# Patient Record
Sex: Female | Born: 1940 | Race: White | Hispanic: No | Marital: Single | Smoking: Never smoker
Health system: Southern US, Community
[De-identification: ages and names within clinical notes are randomized; demographics above are authoritative.]

## PROBLEM LIST (undated history)

## (undated) DIAGNOSIS — G4730 Sleep apnea, unspecified: Secondary | ICD-10-CM

## (undated) DIAGNOSIS — E785 Hyperlipidemia, unspecified: Secondary | ICD-10-CM

## (undated) DIAGNOSIS — I10 Essential (primary) hypertension: Secondary | ICD-10-CM

## (undated) DIAGNOSIS — B351 Tinea unguium: Secondary | ICD-10-CM

## (undated) DIAGNOSIS — M1990 Unspecified osteoarthritis, unspecified site: Secondary | ICD-10-CM

## (undated) DIAGNOSIS — I499 Cardiac arrhythmia, unspecified: Secondary | ICD-10-CM

## (undated) DIAGNOSIS — I493 Ventricular premature depolarization: Secondary | ICD-10-CM

## (undated) DIAGNOSIS — K219 Gastro-esophageal reflux disease without esophagitis: Secondary | ICD-10-CM

## (undated) DIAGNOSIS — D696 Thrombocytopenia, unspecified: Secondary | ICD-10-CM

## (undated) DIAGNOSIS — IMO0001 Reserved for inherently not codable concepts without codable children: Secondary | ICD-10-CM

## (undated) DIAGNOSIS — N6019 Diffuse cystic mastopathy of unspecified breast: Secondary | ICD-10-CM

## (undated) DIAGNOSIS — G5603 Carpal tunnel syndrome, bilateral upper limbs: Secondary | ICD-10-CM

## (undated) DIAGNOSIS — I6521 Occlusion and stenosis of right carotid artery: Secondary | ICD-10-CM

## (undated) HISTORY — PX: BREAST EXCISIONAL BIOPSY: SUR124

## (undated) HISTORY — DX: Ventricular premature depolarization: I49.3

## (undated) HISTORY — DX: Hyperlipidemia, unspecified: E78.5

## (undated) HISTORY — DX: Thrombocytopenia, unspecified: D69.6

## (undated) HISTORY — DX: Tinea unguium: B35.1

## (undated) HISTORY — DX: Diffuse cystic mastopathy of unspecified breast: N60.19

## (undated) HISTORY — DX: Unspecified osteoarthritis, unspecified site: M19.90

## (undated) HISTORY — DX: Essential (primary) hypertension: I10

## (undated) HISTORY — DX: Gastro-esophageal reflux disease without esophagitis: K21.9

## (undated) HISTORY — DX: Carpal tunnel syndrome, bilateral upper limbs: G56.03

---

## 2002-05-07 ENCOUNTER — Encounter: Payer: Self-pay | Admitting: Cardiovascular Disease

## 2002-05-12 ENCOUNTER — Encounter: Payer: Self-pay | Admitting: Cardiovascular Disease

## 2003-02-27 HISTORY — PX: CARDIAC CATHETERIZATION: SHX172

## 2003-10-29 ENCOUNTER — Encounter: Payer: Self-pay | Admitting: Cardiovascular Disease

## 2003-11-04 ENCOUNTER — Encounter: Payer: Self-pay | Admitting: Cardiovascular Disease

## 2003-11-17 ENCOUNTER — Inpatient Hospital Stay (HOSPITAL_COMMUNITY): Admission: EM | Admit: 2003-11-17 | Discharge: 2003-11-18 | Payer: Self-pay | Admitting: *Deleted

## 2004-07-12 ENCOUNTER — Ambulatory Visit: Payer: Self-pay | Admitting: Unknown Physician Specialty

## 2005-07-18 ENCOUNTER — Ambulatory Visit: Payer: Self-pay | Admitting: Unknown Physician Specialty

## 2005-11-21 ENCOUNTER — Ambulatory Visit: Payer: Self-pay | Admitting: Unknown Physician Specialty

## 2006-07-24 ENCOUNTER — Ambulatory Visit: Payer: Self-pay | Admitting: Unknown Physician Specialty

## 2006-11-20 ENCOUNTER — Encounter: Payer: Self-pay | Admitting: Cardiovascular Disease

## 2007-07-30 ENCOUNTER — Ambulatory Visit: Payer: Self-pay | Admitting: Unknown Physician Specialty

## 2008-02-27 HISTORY — PX: CATARACT EXTRACTION: SUR2

## 2008-08-06 ENCOUNTER — Ambulatory Visit: Payer: Self-pay | Admitting: Unknown Physician Specialty

## 2008-09-15 ENCOUNTER — Encounter: Payer: Self-pay | Admitting: Cardiovascular Disease

## 2009-08-10 ENCOUNTER — Ambulatory Visit: Payer: Self-pay | Admitting: Unknown Physician Specialty

## 2009-12-14 ENCOUNTER — Encounter: Payer: Self-pay | Admitting: Cardiovascular Disease

## 2010-04-06 ENCOUNTER — Ambulatory Visit: Payer: Self-pay | Admitting: Unknown Physician Specialty

## 2010-04-07 LAB — PATHOLOGY REPORT

## 2010-05-20 ENCOUNTER — Encounter: Payer: Self-pay | Admitting: Cardiovascular Disease

## 2010-05-20 ENCOUNTER — Emergency Department: Payer: Self-pay | Admitting: Emergency Medicine

## 2010-05-24 ENCOUNTER — Encounter: Payer: Self-pay | Admitting: Cardiovascular Disease

## 2010-05-26 ENCOUNTER — Encounter: Payer: Self-pay | Admitting: Cardiovascular Disease

## 2010-06-06 ENCOUNTER — Encounter: Payer: Self-pay | Admitting: Cardiovascular Disease

## 2010-06-06 ENCOUNTER — Ambulatory Visit (INDEPENDENT_AMBULATORY_CARE_PROVIDER_SITE_OTHER): Payer: PRIVATE HEALTH INSURANCE | Admitting: Cardiovascular Disease

## 2010-06-06 DIAGNOSIS — R079 Chest pain, unspecified: Secondary | ICD-10-CM | POA: Insufficient documentation

## 2010-06-06 DIAGNOSIS — E785 Hyperlipidemia, unspecified: Secondary | ICD-10-CM

## 2010-06-06 DIAGNOSIS — E7849 Other hyperlipidemia: Secondary | ICD-10-CM | POA: Insufficient documentation

## 2010-06-06 DIAGNOSIS — I10 Essential (primary) hypertension: Secondary | ICD-10-CM | POA: Insufficient documentation

## 2010-06-06 DIAGNOSIS — I493 Ventricular premature depolarization: Secondary | ICD-10-CM

## 2010-06-06 DIAGNOSIS — I739 Peripheral vascular disease, unspecified: Secondary | ICD-10-CM | POA: Insufficient documentation

## 2010-06-06 DIAGNOSIS — I4949 Other premature depolarization: Secondary | ICD-10-CM

## 2010-06-06 NOTE — Assessment & Plan Note (Signed)
Rare PVCs by history and on EKG today. She is relatively asymptomatic.

## 2010-06-06 NOTE — Assessment & Plan Note (Signed)
Blood pressure is well controlled on today's visit. No changes made to the medications. 

## 2010-06-06 NOTE — Assessment & Plan Note (Signed)
She had a routine carotid done recently which per her report showed stable disease on the right side.

## 2010-06-06 NOTE — Assessment & Plan Note (Signed)
Symptoms were somewhat atypical but concerning given her previous cardiac catheter showing diagonal disease and given her underlying peripheral vascular disease of the carotid. She has been well since the episode 3 weeks ago. We have suggested that we exercise her on a routine treadmill study which will be done in the next week or so. I suggested that she contact me if she has further episodes of chest discomfort with exertion.

## 2010-06-06 NOTE — Progress Notes (Signed)
   Patient ID: Gabrielle Morris, female    DOB: Mar 10, 1940, 70 y.o.   MRN: [id number]  HPI Comments: 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 Lin Givens for substernal chest pain 2 weeks ago.  She reports that on 21 March She was mowing for several hours and then went inside and had some soup. She did not feel well after the mowing after drinking the soup, she had chest discomfort rated at 8/10 lasting for approximately 2 hours. It resolved without any intervention. Since then she has felt a little bit tired. She has been working back in the garden, taking Halls, planting various plants and mowing again. She is not had any further episodes of chest discomfort with activity. She denies any diaphoresis, arm pain, neck pain or jaw pain with exertion.  She is relatively active at baseline. She is indicated that she does not like to take medications and does not want a cholesterol medication.  EKG shows normal sinus rhythm with rate 76 beats per minute with no significant ST or T wave changes, rare PVC      Review of Systems  Constitutional: Negative.   HENT: Negative.   Eyes: Negative.   Respiratory: Negative.   Cardiovascular: Positive for chest pain.       Episode of chest pain, no further episodes in 3 weeks.  Gastrointestinal: Negative.   Musculoskeletal: Negative.   Skin: Negative.   Neurological: Negative.   Hematological: Negative.   Psychiatric/Behavioral: Negative.   All other systems reviewed and are negative.    BP 132/70  Pulse 76  Ht 5\' 6"  (1.676 m)  Wt 180 lb 12.8 oz (82.01 kg)  BMI 29.18 kg/m2   Physical Exam  Nursing note and vitals reviewed. Constitutional: She is oriented to person, place, and time. She appears well-developed and well-nourished.   HENT:  Head: Normocephalic.  Nose: Nose normal.  Mouth/Throat: Oropharynx is clear and moist.  Eyes: Conjunctivae are normal. Pupils are equal, round, and reactive to light.  Neck: Normal range of motion. Neck supple. No JVD present. Carotid bruit is present.  Cardiovascular: Normal rate, regular rhythm, normal heart sounds and intact distal pulses.  Exam reveals no gallop and no friction rub.   No murmur heard. Pulmonary/Chest: Effort normal and breath sounds normal. No respiratory distress. She has no wheezes. She has no rales. She exhibits no tenderness.  Abdominal: Soft. Bowel sounds are normal. She exhibits no distension. There is no tenderness.  Musculoskeletal: Normal range of motion. She exhibits no edema and no tenderness.  Lymphadenopathy:    She has no cervical adenopathy.  Neurological: She is alert and oriented to person, place, and time. Coordination normal.  Skin: Skin is warm and dry. No rash noted. No erythema.  Psychiatric: She has a normal mood and affect. Her behavior is normal. Judgment and thought content normal.         Assessment and Plan

## 2010-06-06 NOTE — Patient Instructions (Addendum)
We have set up a treadmill study on : 06/19/10 @ 12:30 No medication changes were made. Please call the office for additional episodes of chest pain. Please call us if you have new issues that need to be addressed before your next appt.  We will call you for a follow up Appt.

## 2010-06-06 NOTE — Assessment & Plan Note (Signed)
Ideally, goal LDL should be less than 70, total cholesterol less than 150. Her total cholesterol currently is 190 with LDL of 110. She is not interested in being on a cholesterol medication at this time. We did suggest that she could try a red yeast rice.

## 2010-06-19 ENCOUNTER — Encounter: Payer: Self-pay | Admitting: Cardiovascular Disease

## 2010-06-19 ENCOUNTER — Ambulatory Visit (INDEPENDENT_AMBULATORY_CARE_PROVIDER_SITE_OTHER): Payer: PRIVATE HEALTH INSURANCE | Admitting: Cardiovascular Disease

## 2010-06-19 DIAGNOSIS — R079 Chest pain, unspecified: Secondary | ICD-10-CM

## 2010-06-19 NOTE — Progress Notes (Signed)
  Treadmill ordered for recent epsiodes of chest pain.  Resting EKG shows NSR with rate of 77 bpm, no significant St or T wave changes Resting blood pressure of 122/60 Stand bruce protocal was used.  Patient exercised for 6 min Peak heart rate of 133 bpm.  This was 88% of the maximum predicted heart rate (target heart rate 128). No symptoms of chest pain or lightheadedness were reported at peak stress or in recovery.  Peak Blood pressure recorded was 142/72. Heart rate at 3 minutes in recovery was 87  FINAL IMPRESSION: Normal exercise stress test. No significant EKG changes concerning for ischemia. Good exercise tolerance.

## 2010-06-19 NOTE — Progress Notes (Deleted)
Adult Stress Test Report  06/19/2010   Requesting Physician: Provider Not In System  Study: {noninvasive testing:14697}  Pre-test ECG: {normal/abnormal:14647}  Level of Stress:  ***% age-predicted max HR  *** METS achieved  Functional Capacity: {funct capacity:14698}  Abnormal Symptoms: {symptoms:14699}  Heart Rate Response: {hr response:14700}  BP Response:  {bp response:14701}  Baseline LVEF: Echo *** %,  Nuclear *** %  Stress ECG: {findings; ecg:14702}  Stress Imaging Report:  {findings; stress imaging:14703}   Impression:   {findings; stress test:14704}  Interpreted by:  Lysbeth Galas 06/19/2010

## 2010-07-31 ENCOUNTER — Other Ambulatory Visit: Payer: Self-pay | Admitting: Cardiovascular Disease

## 2010-08-24 ENCOUNTER — Ambulatory Visit: Payer: Self-pay | Admitting: Unknown Physician Specialty

## 2011-02-27 HISTORY — PX: PATELLA FRACTURE SURGERY: SHX735

## 2011-08-06 ENCOUNTER — Ambulatory Visit: Payer: Self-pay | Admitting: Orthopedic Surgery

## 2011-08-07 ENCOUNTER — Observation Stay: Payer: Self-pay

## 2011-08-27 ENCOUNTER — Ambulatory Visit: Payer: Self-pay | Admitting: Internal Medicine

## 2012-08-27 ENCOUNTER — Ambulatory Visit: Payer: Self-pay | Admitting: Internal Medicine

## 2013-06-01 ENCOUNTER — Ambulatory Visit: Payer: Self-pay | Admitting: Orthopedic Surgery

## 2013-06-01 LAB — POTASSIUM: POTASSIUM: 3.9 mmol/L (ref 3.5–5.1)

## 2013-06-09 ENCOUNTER — Ambulatory Visit: Payer: Self-pay | Admitting: Orthopedic Surgery

## 2013-08-31 ENCOUNTER — Ambulatory Visit: Payer: Self-pay | Admitting: Internal Medicine

## 2013-09-03 DIAGNOSIS — R002 Palpitations: Secondary | ICD-10-CM | POA: Insufficient documentation

## 2014-06-19 NOTE — Op Note (Signed)
PATIENT NAME:  Gabrielle Morris, CHICK MR#:  [id number] DATE OF BIRTH:  1941/01/08  DATE OF PROCEDURE:  06/09/2013  PREOPERATIVE DIAGNOSIS: Stenosis painful hardware, right patella.   POSTOPERATIVE DIAGNOSIS: Stenosis painful hardware, right patella.  PROCEDURE: Removal of deep hardware, right patella.   ANESTHESIA: General.   SURGEON: Hessie Knows, M.D.   DESCRIPTION OF PROCEDURE: The patient was brought to the operating room and after adequate anesthesia was obtained, the right leg was prepped and draped in the usual sterile fashion with a tourniquet applied to the upper thigh. After patient identification and timeout procedures were completed, the tourniquet was raised to 300 mmHg. Most of the prior anterior midline skin incision was utilized with the incision down through the skin and subcutaneous tissue. The hardware was visible very quickly and the figure-of-eight tension band wire was exposed first. Following this at the proximal end of the patella within the quad tendon, the ends of the 2 K wires were exposed and the wires removed without difficulty. The tension band wire was then cut and removed without difficulty. The skin was then elevated off the bone where it had become adherent. The wound was irrigated and then wound closed with 3-0 Vicryl subcutaneously and skin staples. 20 mL of 0.5% Sensorcaine without epinephrine was infiltrated for postop analgesia. Sterile dressings of Xeroform, 4 x 4, Webril and Ace wrap applied. Tourniquet time was 20 minutes.  There were no complications. Hardware was discarded.   ESTIMATED BLOOD LOSS: Minimal   ____________________________ Laurene Footman, MD mjm:ce D: 06/09/2013 18:36:45 ET T: 06/09/2013 18:51:17 ET JOB#: [id number]  cc: Laurene Footman, MD, <Dictator> Laurene Footman MD ELECTRONICALLY SIGNED 06/10/2013 11:51

## 2014-06-20 NOTE — Op Note (Signed)
PATIENT NAME:  Gabrielle Morris, Gabrielle Morris MR#:  [id number] DATE OF BIRTH:  1940-03-05  DATE OF PROCEDURE:  08/07/2011  PREOPERATIVE DIAGNOSIS: Right patella fracture.   POSTOPERATIVE DIAGNOSIS: Right patella fracture.  PROCEDURE: Open reduction internal fixation right patella fracture.   SURGEON: Laurene Footman, MD   ANESTHESIA: General.    DESCRIPTION OF PROCEDURE: The patient was brought to the Operating Room, and after adequate anesthesia was obtained the right leg was prepped and draped in the usual sterile fashion. A tourniquet was applied to the upper thigh but not utilized during the procedure. After patient identification and timeout procedures were completed, a midline skin incision was made followed by inspection of the fracture. There displacement in the midportion of the fracture of about 1 cm. A reduction clamp was applied, and on AP and lateral projections near anatomic alignment was obtained. Two K wires were then inserted from distal to proximal across the fracture and visualized proximally. AP and lateral imaging showed these to be in the appropriate position for repair. A tension band wire was then placed in a figure-of-eight fashion and tightened. This gave near anatomic alignment on, again, both AP and lateral projections. After checking this, the proximal end of the pins were bent over and impacted into the proximal patella to bury them and prevent them from being bothersome. The distal ends were then cut short and bent over towards the distal patella and patellar tendon, and the twisted K wire that was holding the tension band was also impacted against the patella to try to minimize its prominence. Permanent C-arm views were obtained. The wound was thoroughly irrigated and closed with 2-0 Vicryl subcutaneously followed by skin staples, Xeroform, 4 x 4's, Webril and Ace wrap and a knee immobilizer that the patient came to the OR with.   ESTIMATED BLOOD LOSS: 50.   COMPLICATIONS: None.    SPECIMEN: None.   IMPLANTS: 2.0 K-wire x2 and large cerclage wire.   ____________________________ Laurene Footman, MD mjm:cbb D: 08/07/2011 21:25:56 ET T: 08/08/2011 09:54:01 ET JOB#: [id number]  cc: Laurene Footman, MD, <Dictator> Laurene Footman MD ELECTRONICALLY SIGNED 08/08/2011 13:03

## 2014-06-20 NOTE — Discharge Summary (Signed)
PATIENT NAME:  Gabrielle Morris, Gabrielle Morris MR#:  [id number] DATE OF BIRTH:  [birth year]  DATE OF ADMISSION:  08/07/2011 DATE OF DISCHARGE:  08/08/2011   ADMITTING DIAGNOSIS: Right patella fracture.   DISCHARGE DIAGNOSIS: Right patella fracture.   PROCEDURE: Open reduction and internal fixation of right patella fracture.   SURGEON: Laurene Footman, M.D.   ANESTHESIA: General.   ESTIMATED BLOOD LOSS: 50 mL.   COMPLICATIONS: None.   IMPLANTS: 2.0 K-wire times two and large cerclage wire.   COMPLICATIONS: None.   The patient was stabilized, brought to the recovery room, and then brought down to the orthopedic floor where she was treated by physical therapy and for pain control.   HISTORY: The patient is a 74 year old female who was walking and walked right into a knee-high concrete structure on 07/27/2011. The patient was seen in the ER and had x-rays which showed a comminuted knee fracture. She was seen in our clinic one week after and was scheduled for surgery. The patient continues to wear the knee immobilizer. She has been taking 4 to 6 oxycodone tablets a day for pain. She denies any hip or ankle injury.   PHYSICAL EXAMINATION: GENERAL: Well-developed, well-nourished female in no apparent distress. Normal affect. She presents in a wheelchair with knee immobilizer on the right lower extremity. RIGHT LOWER EXTREMITY EXAMINATION: Right lower extremity shows effusion within the right knee. There is some mild erythema over the patella, with no warmth or discharge noted. There is also some ecchymosis in the posterior aspect of right lower extremity. The patient has a negative Homans sign. There are no signs of infection. The patient is tender along the medial and lateral joint lines of the right knee. The range of motion is limited secondary to pain. NEUROLOGIC: The patient is neurovascularly intact in the right lower extremity vascular. VASCULAR: The patient has less than 2-second capillary refill.  Dorsalis pedis and posterior tibial pulses are intact in the right lower extremity. HEENT: Head is normocephalic, atraumatic. Pupils equal, round, reactive to light. The patient does not have any dentures. NECK: Symmetric. HEART: Reveals regular rate and rhythm. There is no murmur. There is a normal apical pulse. LUNGS: The lungs are clear to auscultation. There is no wheezing, rales, or rhonchi. There is normal expansion of bilateral chest walls. ABDOMEN: Soft, nontender, nondistended. Bowel sounds are normal.   HOSPITAL COURSE: After admission on 08/07/2011 the patient had surgery that same day. The patient had good pain control afterwards and was brought to the orthopedic floor from the PAC-U. The patient tolerated physical therapy well on postoperative day one. The patient's vital signs remained stable and the patient was ready to go to rehab on postoperative day one on 08/08/2011.   CONDITION AT DISCHARGE: Stable.   DISPOSITION: The patient was sent to rehab facility.   DISCHARGE INSTRUCTIONS:  1. The patient is to keep heels elevated.  2. She is to wear TED hose knee-high bilaterally.  3. She is to use incentive spirometry every one hour.  4. Physical therapy is to be consulted and she is to be evaluated and treated for difficulty walking. She is right extremity weight-bearing as tolerated. They are to evaluate and treat.  5. Activity is weight-bearing as tolerated with immobilizer. Keep knee fully extended.  6. The patient is to have a regular diet.  7. She is to follow up with [facility] orthopedics in 3 to 4 days for wound check and dressing change.   DISCHARGE MEDICATIONS:  1. Vicodin  5/325 tablet, 1 to 2 tablets oral 4-6 hours p.r.n. for pain.  2. Docusate calcium capsule 240 mg oral daily.  3. Milk of Magnesia 30 mL oral b.i.d. for constipation.  4. Aspirin 325 mg oral daily.  5. Zolpidem tablet 10 mg oral at bedtime p.r.n. for insomnia.  6. Pantoprazole tablet 40 mg oral  b.i.d.  7. Incentive spirometry once every hour.   8. Losartan tablet 50 mg oral daily. 9. Aspirin chewable 81 mg oral daily.    ____________________________ Duanne Guess, PA-C tcg:bjt D: 08/08/2011 14:41:32 ET T: 08/08/2011 15:52:47 ET JOB#: [id number]  cc: Duanne Guess, PA-C, <Dictator> Duanne Guess [place] ELECTRONICALLY SIGNED 08/28/2011 16:54

## 2014-07-15 ENCOUNTER — Other Ambulatory Visit: Payer: Self-pay | Admitting: Internal Medicine

## 2014-07-15 DIAGNOSIS — Z1231 Encounter for screening mammogram for malignant neoplasm of breast: Secondary | ICD-10-CM

## 2014-08-31 NOTE — Discharge Instructions (Signed)

## 2014-09-01 ENCOUNTER — Ambulatory Visit: Payer: Medicare Other | Admitting: Anesthesiology

## 2014-09-01 ENCOUNTER — Ambulatory Visit
Admission: RE | Admit: 2014-09-01 | Discharge: 2014-09-01 | Disposition: A | Discharge status: Home / Self Care | Payer: Medicare Other | Source: Ambulatory Visit | Attending: Ophthalmology | Admitting: Ophthalmology

## 2014-09-01 ENCOUNTER — Encounter
Admission: RE | Disposition: A | Discharge status: Home / Self Care | Payer: Self-pay | Source: Ambulatory Visit | Attending: Ophthalmology

## 2014-09-01 DIAGNOSIS — Z9889 Other specified postprocedural states: Secondary | ICD-10-CM | POA: Insufficient documentation

## 2014-09-01 DIAGNOSIS — H269 Unspecified cataract: Secondary | ICD-10-CM | POA: Diagnosis present

## 2014-09-01 DIAGNOSIS — Z79899 Other long term (current) drug therapy: Secondary | ICD-10-CM | POA: Diagnosis not present

## 2014-09-01 DIAGNOSIS — Z9861 Coronary angioplasty status: Secondary | ICD-10-CM | POA: Diagnosis not present

## 2014-09-01 DIAGNOSIS — Z9849 Cataract extraction status, unspecified eye: Secondary | ICD-10-CM | POA: Insufficient documentation

## 2014-09-01 DIAGNOSIS — H2512 Age-related nuclear cataract, left eye: Secondary | ICD-10-CM | POA: Diagnosis not present

## 2014-09-01 DIAGNOSIS — Z7951 Long term (current) use of inhaled steroids: Secondary | ICD-10-CM | POA: Insufficient documentation

## 2014-09-01 DIAGNOSIS — K219 Gastro-esophageal reflux disease without esophagitis: Secondary | ICD-10-CM | POA: Diagnosis not present

## 2014-09-01 DIAGNOSIS — I10 Essential (primary) hypertension: Secondary | ICD-10-CM | POA: Diagnosis not present

## 2014-09-01 HISTORY — DX: Reserved for inherently not codable concepts without codable children: IMO0001

## 2014-09-01 HISTORY — DX: Occlusion and stenosis of right carotid artery: I65.21

## 2014-09-01 HISTORY — PX: CATARACT EXTRACTION W/PHACO: SHX586

## 2014-09-01 HISTORY — DX: Gastro-esophageal reflux disease without esophagitis: K21.9

## 2014-09-01 HISTORY — DX: Cardiac arrhythmia, unspecified: I49.9

## 2014-09-01 SURGERY — PHACOEMULSIFICATION, CATARACT, WITH IOL INSERTION
Anesthesia: Monitor Anesthesia Care | Laterality: Left | Wound class: Clean

## 2014-09-01 MED ORDER — POVIDONE-IODINE 5 % OP SOLN
1.0000 "application " | OPHTHALMIC | Status: DC | PRN
  Administered 2014-09-01: 1 via OPHTHALMIC

## 2014-09-01 MED ORDER — TETRACAINE HCL 0.5 % OP SOLN
1.0000 [drp] | OPHTHALMIC | Status: DC | PRN
  Administered 2014-09-01: 1 [drp] via OPHTHALMIC

## 2014-09-01 MED ORDER — EPINEPHRINE HCL 1 MG/ML IJ SOLN
INTRAOCULAR | Status: DC | PRN
  Administered 2014-09-01: 67 mL via OPHTHALMIC

## 2014-09-01 MED ORDER — CEFUROXIME OPHTHALMIC INJECTION 1 MG/0.1 ML
INJECTION | OPHTHALMIC | Status: DC | PRN
  Administered 2014-09-01: 0.1 mL via INTRACAMERAL

## 2014-09-01 MED ORDER — BRIMONIDINE TARTRATE 0.2 % OP SOLN
OPHTHALMIC | Status: DC | PRN
Start: 2014-09-01 — End: 2014-09-01
  Administered 2014-09-01: 1 [drp] via OPHTHALMIC

## 2014-09-01 MED ORDER — MIDAZOLAM HCL 2 MG/2ML IJ SOLN
INTRAMUSCULAR | Status: DC | PRN
Start: 2014-09-01 — End: 2014-09-01
  Administered 2014-09-01: 2 mg via INTRAVENOUS

## 2014-09-01 MED ORDER — ACETAMINOPHEN 160 MG/5ML PO SOLN: 325.0000 mg | ORAL | Status: DC | PRN

## 2014-09-01 MED ORDER — ARMC OPHTHALMIC DILATING GEL
1.0000 "application " | OPHTHALMIC | Status: DC | PRN
  Administered 2014-09-01 (×2): 1 via OPHTHALMIC

## 2014-09-01 MED ORDER — ACETAMINOPHEN 325 MG PO TABS: 325.0000 mg | ORAL_TABLET | ORAL | Status: DC | PRN

## 2014-09-01 MED ORDER — FENTANYL CITRATE (PF) 100 MCG/2ML IJ SOLN
INTRAMUSCULAR | Status: DC | PRN
Start: 2014-09-01 — End: 2014-09-01
  Administered 2014-09-01: 50 ug via INTRAVENOUS

## 2014-09-01 MED ORDER — NA HYALUR & NA CHOND-NA HYALUR 0.4-0.35 ML IO KIT
PACK | INTRAOCULAR | Status: DC | PRN
  Administered 2014-09-01: 1 mL via INTRAOCULAR

## 2014-09-01 MED ORDER — TIMOLOL MALEATE 0.5 % OP SOLN
OPHTHALMIC | Status: DC | PRN
  Administered 2014-09-01: 1 [drp] via OPHTHALMIC

## 2014-09-01 SURGICAL SUPPLY — 28 items
CANNULA ANT/CHMB 27G (MISCELLANEOUS) ×1 IMPLANT
CANNULA ANT/CHMB 27GA (MISCELLANEOUS) ×3 IMPLANT
GLOVE SURG LX 7.5 STRW (GLOVE) ×2
GLOVE SURG LX STRL 7.5 STRW (GLOVE) ×1 IMPLANT
GLOVE SURG TRIUMPH 8.0 PF LTX (GLOVE) ×3 IMPLANT
GOWN STRL REUS W/ TWL LRG LVL3 (GOWN DISPOSABLE) ×2 IMPLANT
GOWN STRL REUS W/TWL LRG LVL3 (GOWN DISPOSABLE) ×6
LENS IOL ACRSF IQ PC 19.0 (Intraocular Lens) IMPLANT
LENS IOL ACRYSOF IQ POST 19.0 (Intraocular Lens) ×3 IMPLANT
MARKER SKIN SURG W/RULER VIO (MISCELLANEOUS) ×3 IMPLANT
NDL FILTER BLUNT 18X1 1/2 (NEEDLE) ×1 IMPLANT
NDL RETROBULBAR .5 NSTRL (NEEDLE) IMPLANT
NEEDLE FILTER BLUNT 18X 1/2SAF (NEEDLE) ×2
NEEDLE FILTER BLUNT 18X1 1/2 (NEEDLE) ×1 IMPLANT
PACK CATARACT BRASINGTON (MISCELLANEOUS) ×3 IMPLANT
PACK EYE AFTER SURG (MISCELLANEOUS) ×3 IMPLANT
PACK OPTHALMIC (MISCELLANEOUS) ×3 IMPLANT
RING MALYGIN 7.0 (MISCELLANEOUS) IMPLANT
SUT ETHILON 10-0 CS-B-6CS-B-6 (SUTURE)
SUT VICRYL  9 0 (SUTURE)
SUT VICRYL 9 0 (SUTURE) IMPLANT
SUTURE EHLN 10-0 CS-B-6CS-B-6 (SUTURE) IMPLANT
SYR 3ML LL SCALE MARK (SYRINGE) ×3 IMPLANT
SYR 5ML LL (SYRINGE) IMPLANT
SYR TB 1ML LUER SLIP (SYRINGE) ×3 IMPLANT
WATER STERILE IRR 250ML POUR (IV SOLUTION) ×3 IMPLANT
WATER STERILE IRR 500ML POUR (IV SOLUTION) IMPLANT
WIPE NON LINTING 3.25X3.25 (MISCELLANEOUS) ×3 IMPLANT

## 2014-09-01 NOTE — Transfer of Care (Signed)
Immediate Anesthesia Transfer of Care Note  Patient: Gabrielle Morris  Procedure(s) Performed: Procedure(s): CATARACT EXTRACTION PHACO AND INTRAOCULAR LENS PLACEMENT (IOC) (Left)  Patient Location: PACU  Anesthesia Type: MAC  Level of Consciousness: awake, alert  and patient cooperative  Airway and Oxygen Therapy: Patient Spontanous Breathing and Patient connected to supplemental oxygen  Post-op Assessment: Post-op Vital signs reviewed, Patient's Cardiovascular Status Stable, Respiratory Function Stable, Patent Airway and No signs of Nausea or vomiting  Post-op Vital Signs: Reviewed and stable  Complications: No apparent anesthesia complications

## 2014-09-01 NOTE — Anesthesia Postprocedure Evaluation (Signed)
  Anesthesia Post-op Note  Patient: Gabrielle Morris  Procedure(s) Performed: Procedure(s): CATARACT EXTRACTION PHACO AND INTRAOCULAR LENS PLACEMENT (IOC) (Left)  Anesthesia type:MAC  Patient location: PACU  Post pain: Pain level controlled  Post assessment: Post-op Vital signs reviewed, Patient's Cardiovascular Status Stable, Respiratory Function Stable, Patent Airway and No signs of Nausea or vomiting  Post vital signs: Reviewed and stable  Last Vitals:  Filed Vitals:   09/01/14 0945  BP: 143/62  Pulse: 72  Temp:   Resp: 14    Level of consciousness: awake, alert  and patient cooperative  Complications: No apparent anesthesia complications

## 2014-09-01 NOTE — H&P (Signed)
  The History and Physical notes were scanned in.  The patient remains stable and unchanged from the H&P.   Previous H&P reviewed, patient examined, and there are no changes.  Shalin Vonbargen 09/01/2014 8:40 AM

## 2014-09-01 NOTE — Op Note (Signed)
OPERATIVE NOTE  Gabrielle Morris [id number] 09/01/2014   PREOPERATIVE DIAGNOSIS:  Nuclear sclerotic cataract left eye. H25.12   POSTOPERATIVE DIAGNOSIS:    Nuclear sclerotic cataract left eye.     PROCEDURE:  Phacoemusification with posterior chamber intraocular lens placement of the left eye   LENS:   Implant Name Type Inv. Item Serial No. Manufacturer Lot No. LRB No. Used  IMPLANT LENS - [id number] Intraocular Lens IMPLANT LENS [id number] ALCON   Left 1     [id number] 22.0 D   ULTRASOUND TIME: 12.5  % of 1 minutes 15 seconds, CDE 9.5  SURGEON:  Wyonia Hough, MD   ANESTHESIA:  Topical with tetracaine drops and 2% Xylocaine jelly.   COMPLICATIONS:  None.   DESCRIPTION OF PROCEDURE:  The patient was identified in the holding room and transported to the operating room and placed in the supine position under the operating microscope.  The left eye was identified as the operative eye and it was prepped and draped in the usual sterile ophthalmic fashion.   A 1 millimeter clear-corneal paracentesis was made at the 1:30 position.  The anterior chamber was filled with Viscoat viscoelastic.  A 2.4 millimeter keratome was used to make a near-clear corneal incision at the 10:30 position.  .  A curvilinear capsulorrhexis was made with a cystotome and capsulorrhexis forceps.  Balanced salt solution was used to hydrodissect and hydrodelineate the nucleus.   Phacoemulsification was then used in stop and chop fashion to remove the lens nucleus and epinucleus.  The remaining cortex was then removed using the irrigation and aspiration handpiece. Provisc was then placed into the capsular bag to distend it for lens placement.  A lens was then injected into the capsular bag.  The remaining viscoelastic was aspirated.   Wounds were hydrated with balanced salt solution.  The anterior chamber was inflated to a physiologic pressure with balanced salt solution.  No wound leaks were noted. Cefuroxime 0.1  ml of a 10mg /ml solution was injected into the anterior chamber for a dose of 1 mg of intracameral antibiotic at the completion of the case.   Timolol and Brimonidine drops were applied to the eye.  The patient was taken to the recovery room in stable condition without complications of anesthesia or surgery.  Chudney Scheffler 09/01/2014, 9:41 AM

## 2014-09-01 NOTE — Anesthesia Preprocedure Evaluation (Signed)
Anesthesia Evaluation  Patient identified by MRN, date of birth, ID band  Reviewed: Allergy & Precautions, H&P , NPO status , Patient's Chart, lab work & pertinent test results  Airway Mallampati: II  TM Distance: >3 FB Neck ROM: full    Dental no notable dental hx.    Pulmonary    Pulmonary exam normal       Cardiovascular hypertension, + Peripheral Vascular Disease Rhythm:regular Rate:Normal     Neuro/Psych    GI/Hepatic GERD-  ,  Endo/Other    Renal/GU      Musculoskeletal   Abdominal   Peds  Hematology   Anesthesia Other Findings   Reproductive/Obstetrics                             Anesthesia Physical Anesthesia Plan  ASA: II  Anesthesia Plan: MAC   Post-op Pain Management:    Induction:   Airway Management Planned:   Additional Equipment:   Intra-op Plan:   Post-operative Plan:   Informed Consent: I have reviewed the patients History and Physical, chart, labs and discussed the procedure including the risks, benefits and alternatives for the proposed anesthesia with the patient or authorized representative who has indicated his/her understanding and acceptance.     Plan Discussed with: CRNA  Anesthesia Plan Comments:         Anesthesia Quick Evaluation

## 2014-09-02 ENCOUNTER — Ambulatory Visit
Admission: RE | Admit: 2014-09-02 | Discharge: 2014-09-02 | Disposition: A | Discharge status: Home / Self Care | Payer: Medicare Other | Source: Ambulatory Visit | Attending: Internal Medicine | Admitting: Internal Medicine

## 2014-09-02 ENCOUNTER — Encounter: Payer: Self-pay | Admitting: Ophthalmology

## 2014-09-02 DIAGNOSIS — Z1231 Encounter for screening mammogram for malignant neoplasm of breast: Secondary | ICD-10-CM | POA: Insufficient documentation

## 2014-09-08 DIAGNOSIS — I10 Essential (primary) hypertension: Secondary | ICD-10-CM | POA: Insufficient documentation

## 2015-03-17 ENCOUNTER — Ambulatory Visit: Payer: Medicare HMO | Admitting: Anesthesiology

## 2015-03-17 ENCOUNTER — Encounter: Payer: Self-pay | Admitting: *Deleted

## 2015-03-17 ENCOUNTER — Encounter
Admission: RE | Disposition: A | Discharge status: Home / Self Care | Payer: Self-pay | Source: Ambulatory Visit | Attending: Unknown Physician Specialty

## 2015-03-17 ENCOUNTER — Ambulatory Visit
Admission: RE | Admit: 2015-03-17 | Discharge: 2015-03-17 | Disposition: A | Discharge status: Home / Self Care | Payer: Medicare HMO | Source: Ambulatory Visit | Attending: Unknown Physician Specialty | Admitting: Unknown Physician Specialty

## 2015-03-17 DIAGNOSIS — Z79899 Other long term (current) drug therapy: Secondary | ICD-10-CM | POA: Insufficient documentation

## 2015-03-17 DIAGNOSIS — K640 First degree hemorrhoids: Secondary | ICD-10-CM | POA: Diagnosis not present

## 2015-03-17 DIAGNOSIS — K219 Gastro-esophageal reflux disease without esophagitis: Secondary | ICD-10-CM | POA: Diagnosis not present

## 2015-03-17 DIAGNOSIS — M1990 Unspecified osteoarthritis, unspecified site: Secondary | ICD-10-CM | POA: Diagnosis not present

## 2015-03-17 DIAGNOSIS — Z1211 Encounter for screening for malignant neoplasm of colon: Secondary | ICD-10-CM | POA: Diagnosis present

## 2015-03-17 DIAGNOSIS — Z7951 Long term (current) use of inhaled steroids: Secondary | ICD-10-CM | POA: Diagnosis not present

## 2015-03-17 DIAGNOSIS — E785 Hyperlipidemia, unspecified: Secondary | ICD-10-CM | POA: Insufficient documentation

## 2015-03-17 DIAGNOSIS — I10 Essential (primary) hypertension: Secondary | ICD-10-CM | POA: Insufficient documentation

## 2015-03-17 DIAGNOSIS — Z8371 Family history of colonic polyps: Secondary | ICD-10-CM | POA: Insufficient documentation

## 2015-03-17 HISTORY — PX: COLONOSCOPY WITH PROPOFOL: SHX5780

## 2015-03-17 SURGERY — COLONOSCOPY WITH PROPOFOL
Anesthesia: General

## 2015-03-17 MED ORDER — SODIUM CHLORIDE 0.9 % IV SOLN
INTRAVENOUS | Status: DC
  Administered 2015-03-17: 16:00:00 via INTRAVENOUS
  Administered 2015-03-17: 1000 mL via INTRAVENOUS

## 2015-03-17 MED ORDER — PHENYLEPHRINE HCL 10 MG/ML IJ SOLN
INTRAMUSCULAR | Status: DC | PRN
  Administered 2015-03-17: 100 ug via INTRAVENOUS

## 2015-03-17 MED ORDER — SODIUM CHLORIDE 0.9 % IV SOLN: INTRAVENOUS | Status: DC

## 2015-03-17 MED ORDER — PROPOFOL 500 MG/50ML IV EMUL
INTRAVENOUS | Status: DC | PRN
  Administered 2015-03-17: 120 ug/kg/min via INTRAVENOUS

## 2015-03-17 MED ORDER — LIDOCAINE HCL (CARDIAC) 20 MG/ML IV SOLN
INTRAVENOUS | Status: DC | PRN
  Administered 2015-03-17: 60 mg via INTRAVENOUS

## 2015-03-17 MED ORDER — MIDAZOLAM HCL 2 MG/2ML IJ SOLN
INTRAMUSCULAR | Status: DC | PRN
  Administered 2015-03-17: 1 mg via INTRAVENOUS

## 2015-03-17 MED ORDER — PROPOFOL 10 MG/ML IV BOLUS
INTRAVENOUS | Status: DC | PRN
  Administered 2015-03-17: 80 mg via INTRAVENOUS
  Administered 2015-03-17: 10 mg via INTRAVENOUS

## 2015-03-17 NOTE — Transfer of Care (Signed)
Immediate Anesthesia Transfer of Care Note  Patient: Gabrielle Morris  Procedure(s) Performed: Procedure(s): COLONOSCOPY WITH PROPOFOL (N/A)  Patient Location: PACU  Anesthesia Type:General  Level of Consciousness: awake and patient cooperative  Airway & Oxygen Therapy: Patient Spontanous Breathing and Patient connected to nasal cannula oxygen  Post-op Assessment: Report given to RN  Post vital signs: Reviewed and stable  Last Vitals:  Filed Vitals:   03/17/15 1435 03/17/15 1620  BP: 130/54 103/50  Pulse: 78 75  Temp: 36.1 C 36.1 C  Resp: 16 22    Complications: No apparent anesthesia complications

## 2015-03-17 NOTE — H&P (Signed)
Primary Care Physician:  PROVIDER NOT IN SYSTEM Primary Gastroenterologist:  Dr. Vira Agar  Pre-Procedure History & Physical: HPI:  Gabrielle Morris is a 75 y.o. female is here for an colonoscopy.   Past Medical History  Diagnosis Date  . Thrombocytopenia (HCC)     mild  . Neutropenia   . Onychomycosis   . Fibrocystic breast disease   . Premature ventricular contractions     DR Mabeline Caras CONSULTED, EKG IN JAN NEXT ONE IN [place]  . Hyperlipidemia   . Osteoporosis   . Reflux   . Hypertension     CONTROLLED ON MEDS  . Arthritis     JOINT STIFFNESS  . GERD (gastroesophageal reflux disease)   . Dysrhythmia     PVC'S  LAST YEAR/ NONE RECENTLY/DR KOWALSKY   . Right carotid artery occlusion     /SLIGHT NARROWING  . Bilateral carpal tunnel syndrome     Past Surgical History  Procedure Laterality Date  . Patella fracture surgery Right 2013    HARDWARE REMOVED 2015 SEPERATE SURGERY  . Cataract extraction Right 2010  . Cardiac catheterization  2005    negatvie CAD, right partial carotid artery blockage  . Cataract extraction w/phaco Left 09/01/2014    Procedure: CATARACT EXTRACTION PHACO AND INTRAOCULAR LENS PLACEMENT (IOC);  Surgeon: Leandrew Koyanagi, MD;  Location: [facility];  Service: Ophthalmology;  Laterality: Left;  . Breast biopsy Left [id number]    neg    Prior to Admission medications   Medication Sig Start Date End Date Taking? Authorizing Provider  acetaminophen (TYLENOL) 500 MG tablet Take 500 mg by mouth as needed for mild pain.   Yes Historical Provider, MD  aspirin 81 MG EC tablet Take 81 mg by mouth daily. am   Yes Historical Provider, MD  atorvastatin (LIPITOR) 40 MG tablet Take 40 mg by mouth at bedtime.   Yes Historical Provider, MD  Calcium Citrate-Vitamin D (CALCIUM CITRATE + D3 MAXIMUM) 315-250 MG-UNIT TABS Take by mouth daily. AM 2 TABS   Yes Historical Provider, MD  Cholecalciferol (VITAMIN D3) 2000 UNITS TABS Take by mouth. AM   Yes  Historical Provider, MD  fluticasone (FLONASE) 50 MCG/ACT nasal spray Place 2 sprays into the nose as needed. Pm   Yes Historical Provider, MD  losartan (COZAAR) 50 MG tablet Take 50 mg by mouth daily. AM   Yes Historical Provider, MD  omeprazole (PRILOSEC) 20 MG capsule Take 20 mg by mouth daily.     Yes Historical Provider, MD    Allergies as of 02/17/2015  . (No Known Allergies)    Family History  Problem Relation Age of Onset  . Heart disease Other   . Hypertension Other   . Hypertension Maternal Aunt   . Hypertension Paternal Aunt     Social History   Social History  . Marital Status: Single    Spouse Name: N/A  . Number of Children: N/A  . Years of Education: N/A   Occupational History  . Not on file.   Social History Main Topics  . Smoking status: Never Smoker   . Smokeless tobacco: Never Used  . Alcohol Use: No  . Drug Use: No  . Sexual Activity: Not on file   Other Topics Concern  . Not on file   Social History Narrative    Review of Systems: See HPI, otherwise negative ROS  Physical Exam: BP 130/54 mmHg  Pulse 78  Temp(Src) 97 F (36.1 C) (Tympanic)  Resp 16  Ht  5\' 5"  (1.651 m)  Wt 92.987 kg (205 lb)  BMI 34.11 kg/m2  SpO2 100% General:   Alert,  pleasant and cooperative in NAD Head:  Normocephalic and atraumatic. Neck:  Supple; no masses or thyromegaly. Lungs:  Clear throughout to auscultation.    Heart:  Regular rate and rhythm. Abdomen:  Soft, nontender and nondistended. Normal bowel sounds, without guarding, and without rebound.   Neurologic:  Alert and  oriented x4;  grossly normal neurologically.  Impression/Plan: Gabrielle Morris is here for an colonoscopy to be performed for FH colon polyps  Risks, benefits, limitations, and alternatives regarding  colonoscopy have been reviewed with the patient.  Questions have been answered.  All parties agreeable.   Gaylyn Cheers, MD  03/17/2015, 3:53 PM

## 2015-03-17 NOTE — Op Note (Signed)
[facility] Gastroenterology Patient Name: Gabrielle Morris Procedure Date: 03/17/2015 3:54 PM MRN: [id number] Account #: [id number] Date of Birth: 1940-07-04 Admit Type: Outpatient Age: 75 Room: [facility] ENDO ROOM 1 Gender: Female Note Status: Finalized Procedure:         Colonoscopy Indications:       Colon cancer screening in patient at increased risk:                     Family history of 1st-degree relative with colon polyps Providers:         Manya Silvas, MD Referring MD:      No Local Md, MD (Referring MD) Medicines:         Propofol per Anesthesia Complications:     No immediate complications. Procedure:         Pre-Anesthesia Assessment:                    - After reviewing the risks and benefits, the patient was                     deemed in satisfactory condition to undergo the procedure.                    After obtaining informed consent, the colonoscope was                     passed under direct vision. Throughout the procedure, the                     patient's blood pressure, pulse, and oxygen saturations                     were monitored continuously. The Colonoscope was                     introduced through the anus and advanced to the the cecum,                     identified by appendiceal orifice and ileocecal valve. The                     colonoscopy was performed without difficulty. The patient                     tolerated the procedure well. The quality of the bowel                     preparation was good. Findings:      Internal hemorrhoids were found during endoscopy. The hemorrhoids were       small and Grade I (internal hemorrhoids that do not prolapse).      The exam was otherwise without abnormality. Impression:        - Internal hemorrhoids.                    - The examination was otherwise normal.                    - No specimens collected. Recommendation:    - Await pathology results. Manya Silvas, MD 03/17/2015  4:15:04 PM This report has been signed electronically. Number of Addenda: 0 Note Initiated On: 03/17/2015 3:54 PM Scope Withdrawal Time: 0 hours 7 minutes 55 seconds  Total Procedure Duration: 0 hours 13 minutes 37  seconds       [facility]

## 2015-03-17 NOTE — Anesthesia Postprocedure Evaluation (Signed)
Anesthesia Post Note  Patient: Gabrielle Morris  Procedure(s) Performed: Procedure(s) (LRB): COLONOSCOPY WITH PROPOFOL (N/A)  Patient location during evaluation: Endoscopy Anesthesia Type: General Level of consciousness: awake and alert Pain management: pain level controlled Vital Signs Assessment: post-procedure vital signs reviewed and stable Respiratory status: spontaneous breathing, nonlabored ventilation, respiratory function stable and patient connected to nasal cannula oxygen Cardiovascular status: blood pressure returned to baseline and stable Postop Assessment: no signs of nausea or vomiting Anesthetic complications: no    Last Vitals:  Filed Vitals:   03/17/15 1640 03/17/15 1650  BP: 108/52 125/57  Pulse: 79 75  Temp:    Resp: 15 14    Last Pain: There were no vitals filed for this visit.               Martha Clan

## 2015-03-17 NOTE — Anesthesia Preprocedure Evaluation (Signed)
Anesthesia Evaluation  Patient identified by MRN, date of birth, ID band Patient awake    Reviewed: Allergy & Precautions, H&P , NPO status , Patient's Chart, lab work & pertinent test results, reviewed documented beta blocker date and time   History of Anesthesia Complications Negative for: history of anesthetic complications  Airway Mallampati: III  TM Distance: >3 FB Neck ROM: full    Dental no notable dental hx. (+) Caps, Teeth Intact   Pulmonary neg pulmonary ROS,    Pulmonary exam normal breath sounds clear to auscultation       Cardiovascular Exercise Tolerance: Good hypertension, On Medications (-) angina+ Peripheral Vascular Disease  (-) CAD, (-) Past MI, (-) Cardiac Stents and (-) CABG Normal cardiovascular exam+ dysrhythmias (PVCs) (-) Valvular Problems/Murmurs Rhythm:regular Rate:Normal     Neuro/Psych neg Seizures  Neuromuscular disease (bilateral carpal tunnel sydrome) negative psych ROS   GI/Hepatic Neg liver ROS, GERD  Medicated and Controlled,  Endo/Other  negative endocrine ROS  Renal/GU negative Renal ROS  negative genitourinary   Musculoskeletal   Abdominal   Peds  Hematology negative hematology ROS (+)   Anesthesia Other Findings Past Medical History:   Thrombocytopenia ([place])                                         Comment:mild   Neutropenia                                                  Onychomycosis                                                Fibrocystic breast disease                                   Premature ventricular contractions                             Comment:DR KOWALSKY CONSULTED, EKG IN JAN NEXT ONE IN               [place]   Hyperlipidemia                                               Osteoporosis                                                 Reflux                                                       Hypertension  Comment:CONTROLLED ON MEDS   Arthritis                                                      Comment:JOINT STIFFNESS   GERD (gastroesophageal reflux disease)                       Dysrhythmia                                                    Comment:PVC'S  LAST YEAR/ NONE RECENTLY/DR KOWALSKY    Right carotid artery occlusion                                 Comment:/SLIGHT NARROWING   Bilateral carpal tunnel syndrome                             Reproductive/Obstetrics negative OB ROS                             Anesthesia Physical Anesthesia Plan  ASA: II  Anesthesia Plan: General   Post-op Pain Management:    Induction:   Airway Management Planned:   Additional Equipment:   Intra-op Plan:   Post-operative Plan:   Informed Consent: I have reviewed the patients History and Physical, chart, labs and discussed the procedure including the risks, benefits and alternatives for the proposed anesthesia with the patient or authorized representative who has indicated his/her understanding and acceptance.   Dental Advisory Given  Plan Discussed with: Anesthesiologist, CRNA and Surgeon  Anesthesia Plan Comments:         Anesthesia Quick Evaluation

## 2015-03-21 ENCOUNTER — Encounter: Payer: Self-pay | Admitting: Unknown Physician Specialty

## 2015-08-04 ENCOUNTER — Other Ambulatory Visit: Payer: Self-pay | Admitting: Internal Medicine

## 2015-08-04 DIAGNOSIS — Z1231 Encounter for screening mammogram for malignant neoplasm of breast: Secondary | ICD-10-CM

## 2015-09-07 ENCOUNTER — Other Ambulatory Visit: Payer: Self-pay | Admitting: Internal Medicine

## 2015-09-07 ENCOUNTER — Ambulatory Visit
Admission: RE | Admit: 2015-09-07 | Discharge: 2015-09-07 | Disposition: A | Discharge status: Home / Self Care | Payer: Medicare HMO | Source: Ambulatory Visit | Attending: Internal Medicine | Admitting: Internal Medicine

## 2015-09-07 DIAGNOSIS — Z1231 Encounter for screening mammogram for malignant neoplasm of breast: Secondary | ICD-10-CM

## 2016-06-25 ENCOUNTER — Ambulatory Visit (INDEPENDENT_AMBULATORY_CARE_PROVIDER_SITE_OTHER): Payer: Medicare HMO

## 2016-06-25 ENCOUNTER — Encounter (INDEPENDENT_AMBULATORY_CARE_PROVIDER_SITE_OTHER): Payer: Self-pay | Admitting: Vascular Surgery

## 2016-06-25 ENCOUNTER — Other Ambulatory Visit (INDEPENDENT_AMBULATORY_CARE_PROVIDER_SITE_OTHER): Payer: Self-pay | Admitting: Vascular Surgery

## 2016-06-25 ENCOUNTER — Ambulatory Visit (INDEPENDENT_AMBULATORY_CARE_PROVIDER_SITE_OTHER): Payer: Medicare HMO | Admitting: Vascular Surgery

## 2016-06-25 VITALS — BP 135/65 | HR 79 | Resp 16 | Wt 200.8 lb

## 2016-06-25 DIAGNOSIS — I6529 Occlusion and stenosis of unspecified carotid artery: Secondary | ICD-10-CM | POA: Insufficient documentation

## 2016-06-25 DIAGNOSIS — I6523 Occlusion and stenosis of bilateral carotid arteries: Secondary | ICD-10-CM | POA: Diagnosis not present

## 2016-06-25 DIAGNOSIS — I739 Peripheral vascular disease, unspecified: Secondary | ICD-10-CM

## 2016-06-25 DIAGNOSIS — I10 Essential (primary) hypertension: Secondary | ICD-10-CM | POA: Diagnosis not present

## 2016-06-25 DIAGNOSIS — E782 Mixed hyperlipidemia: Secondary | ICD-10-CM

## 2016-06-25 LAB — VAS US CAROTID
LCCAPSYS: 117 cm/s
LICADDIAS: -12 cm/s
Left CCA dist dias: 13 cm/s
Left CCA dist sys: 93 cm/s
Left CCA prox dias: 11 cm/s
Left ICA dist sys: -84 cm/s
Left ICA prox dias: 25 cm/s
Left ICA prox sys: 168 cm/s
RCCADSYS: -120 cm/s
RCCAPDIAS: 13 cm/s
RIGHT CCA MID DIAS: 14 cm/s
RIGHT ECA DIAS: 74 cm/s
Right CCA prox sys: 98 cm/s

## 2016-06-25 NOTE — Progress Notes (Signed)
MRN : [id number]  Gabrielle Morris is a 76 y.o. (10/17/1940) female who presents with chief complaint of  Chief Complaint  Patient presents with  . Follow-up  .  History of Present Illness: The patient is seen for follow up evaluation of carotid stenosis. The carotid stenosis followed by ultrasound.   The patient denies amaurosis fugax. There is no recent history of TIA symptoms or focal motor deficits. There is no prior documented CVA.  The patient is taking enteric-coated aspirin 81 mg daily.  There is no history of migraine headaches. There is no history of seizures.  The patient has a history of coronary artery disease, no recent episodes of angina or shortness of breath. The patient denies PAD or claudication symptoms. There is a history of hyperlipidemia which is being treated with a statin.      Current Meds  Medication Sig  . acetaminophen (TYLENOL) 500 MG tablet Take 500 mg by mouth as needed for mild pain.  Marland Kitchen aspirin 81 MG EC tablet Take 81 mg by mouth daily. am  . atorvastatin (LIPITOR) 40 MG tablet Take 40 mg by mouth at bedtime.  . Calcium Citrate-Vitamin D (CALCIUM CITRATE + D3 MAXIMUM) 315-250 MG-UNIT TABS Take by mouth daily. AM 2 TABS  . Cholecalciferol (VITAMIN D3) 2000 UNITS TABS Take by mouth. AM  . fluticasone (FLONASE) 50 MCG/ACT nasal spray Place 2 sprays into the nose as needed. Pm  . hydrochlorothiazide (HYDRODIURIL) 12.5 MG tablet   . losartan (COZAAR) 100 MG tablet   . omeprazole (PRILOSEC) 20 MG capsule Take 20 mg by mouth daily.      Past Medical History:  Diagnosis Date  . Arthritis    JOINT STIFFNESS  . Bilateral carpal tunnel syndrome   . Dysrhythmia    PVC'S  LAST YEAR/ NONE RECENTLY/DR KOWALSKY   . Fibrocystic breast disease   . GERD (gastroesophageal reflux disease)   . Hyperlipidemia   . Hypertension    CONTROLLED ON MEDS  . Neutropenia   . Onychomycosis   . Osteoporosis   . Premature ventricular contractions    DR  Mabeline Caras CONSULTED, EKG IN [place]AN NEXT ONE IN [place]  . Reflux   . Right carotid artery occlusion    /SLIGHT NARROWING  . Thrombocytopenia ([place])    mild    Past Surgical History:  Procedure Laterality Date  . BREAST BIOPSY Left 1991,1992   neg  . CARDIAC CATHETERIZATION  2005   negatvie CAD, right partial carotid artery blockage  . CATARACT EXTRACTION Right 2010  . CATARACT EXTRACTION W/PHACO Left 09/01/2014   Procedure: CATARACT EXTRACTION PHACO AND INTRAOCULAR LENS PLACEMENT (IOC);  Surgeon: Leandrew Koyanagi, MD;  Location: [place];  Service: Ophthalmology;  Laterality: Left;  . COLONOSCOPY WITH PROPOFOL N/A 03/17/2015   Procedure: COLONOSCOPY WITH PROPOFOL;  Surgeon: Manya Silvas, MD;  Location: [facility] ENDOSCOPY;  Service: Endoscopy;  Laterality: N/A;  . PATELLA FRACTURE SURGERY Right 2013   HARDWARE REMOVED 2015 SEPERATE SURGERY    Social History Social History  Substance Use Topics  . Smoking status: Never Smoker  . Smokeless tobacco: Never Used  . Alcohol use No    Family History Family History  Problem Relation Age of Onset  . Heart disease Other   . Hypertension Other   . Hypertension Maternal Aunt   . Breast cancer Maternal Aunt   . Hypertension Paternal Aunt   . Breast cancer Paternal Aunt     No Known Allergies   REVIEW  OF SYSTEMS (Negative unless checked)  Constitutional: [] Weight loss  [] Fever  [] Chills Cardiac: [] Chest pain   [] Chest pressure   [] Palpitations   [] Shortness of breath when laying flat   [] Shortness of breath with exertion. Vascular:  [] Pain in legs with walking   [] Pain in legs at rest  [] History of DVT   [] Phlebitis   [] Swelling in legs   [] Varicose veins   [] Non-healing ulcers Pulmonary:   [] Uses home oxygen   [] Productive cough   [] Hemoptysis   [] Wheeze  [] COPD   [] Asthma Neurologic:  [] Dizziness   [] Seizures   [] History of stroke   [] History of TIA  [] Aphasia   [] Vissual changes   [] Weakness or numbness in arm   [] Weakness  or numbness in leg Musculoskeletal:   [] Joint swelling   [] Joint pain   [] Low back pain Hematologic:  [] Easy bruising  [] Easy bleeding   [] Hypercoagulable state   [] Anemic Gastrointestinal:  [] Diarrhea   [] Vomiting  [] Gastroesophageal reflux/heartburn   [] Difficulty swallowing. Genitourinary:  [] Chronic kidney disease   [] Difficult urination  [] Frequent urination   [] Blood in urine Skin:  [] Rashes   [] Ulcers  Psychological:  [] History of anxiety   []  History of major depression.  Physical Examination  Vitals:   06/25/16 1408  BP: 135/65  Pulse: 79  Resp: 16  Weight: 200 lb 12.8 oz (91.1 kg)   Body mass index is 33.41 kg/m. Gen: WD/WN, NAD Head: [place]/AT, No temporalis wasting.  Ear/Nose/Throat: Hearing grossly intact, nares w/o erythema or drainage Eyes: PER, EOMI, sclera nonicteric.  Neck: Supple, no large masses.   Pulmonary:  Good air movement, no audible wheezing bilaterally, no use of accessory muscles.  Cardiac: RRR, no JVD Vascular: bilateral carotid bruits Vessel Right Left  Radial Palpable Palpable  Ulnar Palpable Palpable  Brachial Palpable Palpable  PT Palpable Palpable  DP Palpable Palpable  Gastrointestinal: Non-distended. No guarding/no peritoneal signs.  Musculoskeletal: M/S 5/5 throughout.  No deformity or atrophy.  Neurologic: CN 2-12 intact. Symmetrical.  Speech is fluent. Motor exam as listed above. Psychiatric: Judgment intact, Mood & affect appropriate for pt's clinical situation. Dermatologic: No rashes or ulcers noted.  No changes consistent with cellulitis. Lymph : No lichenification or skin changes of chronic lymphedema.  CBC No results found for: WBC, HGB, HCT, MCV, PLT  BMET    Component Value Date/Time   K 3.9 06/01/2013 1345   CrCl cannot be calculated (No order found.).  COAG No results found for: INR, PROTIME  Radiology No results found.   Assessment/Plan 1. Bilateral carotid artery stenosis Recommend:  Given the patient's  asymptomatic subcritical stenosis no further invasive testing or surgery at this time.  Duplex ultrasound shows <60% stenosis bilaterally.  Continue antiplatelet therapy as prescribed Continue management of CAD, HTN and Hyperlipidemia Healthy heart diet,  encouraged exercise at least 4 times per week Follow up in 12 months with duplex ultrasound and physical exam based on <60% stenosis of the carotid artery   - VAS US CAROTID; Future  2. PVD (peripheral vascular disease) ([place])  Recommend:  The patient has evidence of atherosclerosis of the lower extremities with claudication.  The patient does not voice lifestyle limiting changes at this point in time.  Noninvasive studies do not suggest clinically significant change.  No invasive studies, angiography or surgery at this time The patient should continue walking and begin a more formal exercise program.  The patient should continue antiplatelet therapy and aggressive treatment of the lipid abnormalities  No changes in the  patient's medications at this time  The patient should continue wearing graduated compression socks 10-15 mmHg strength to control the mild edema.    3. Essential hypertension Continue antihypertensive medications as already ordered, these medications have been reviewed and there are no changes at this time.   4. Mixed hyperlipidemia Continue statin as ordered and reviewed, no changes at this time   Hortencia Pilar, MD  06/25/2016 2:24 PM

## 2016-07-26 ENCOUNTER — Other Ambulatory Visit: Payer: Self-pay | Admitting: Internal Medicine

## 2016-07-26 DIAGNOSIS — Z1231 Encounter for screening mammogram for malignant neoplasm of breast: Secondary | ICD-10-CM

## 2016-09-10 ENCOUNTER — Ambulatory Visit
Admission: RE | Admit: 2016-09-10 | Discharge: 2016-09-10 | Disposition: A | Discharge status: Home / Self Care | Payer: Medicare HMO | Source: Ambulatory Visit | Attending: Internal Medicine | Admitting: Internal Medicine

## 2016-09-10 DIAGNOSIS — Z1231 Encounter for screening mammogram for malignant neoplasm of breast: Secondary | ICD-10-CM

## 2017-03-17 ENCOUNTER — Encounter: Payer: Self-pay | Admitting: Gynecology

## 2017-03-17 ENCOUNTER — Other Ambulatory Visit: Payer: Self-pay

## 2017-03-17 ENCOUNTER — Ambulatory Visit
Admission: EM | Admit: 2017-03-17 | Discharge: 2017-03-17 | Disposition: A | Discharge status: Home / Self Care | Payer: Medicare HMO | Attending: Family Medicine | Admitting: Family Medicine

## 2017-03-17 DIAGNOSIS — J0110 Acute frontal sinusitis, unspecified: Secondary | ICD-10-CM | POA: Diagnosis not present

## 2017-03-17 MED ORDER — AMOXICILLIN-POT CLAVULANATE 875-125 MG PO TABS: 1.0000 | ORAL_TABLET | Freq: Two times a day (BID) | ORAL | 0 refills | Status: DC

## 2017-03-17 NOTE — ED Triage Notes (Signed)
Per patient coughing on and off x 2 weeks. Pet patient nasal drainage.

## 2017-03-17 NOTE — Discharge Instructions (Signed)
Take medication as prescribed. Rest. Drink plenty of fluids.  ° °Follow up with your primary care physician this week as needed. Return to Urgent care for new or worsening concerns.  ° °

## 2017-03-17 NOTE — ED Provider Notes (Signed)
[facility] ____________________________________________  Time seen: Approximately 12:49 PM  I have reviewed the triage vital signs and the nursing notes.   HISTORY  Chief Complaint Cough   HPI Gabrielle Morris is a 77 y.o. female presenting for evaluation of 1.5-2 weeks of runny nose, nasal congestion, cough and intermittent frontal headache.  States initially she thought she had developed a cold, but reports symptoms continued cough is overall a nonproductive dry cough and described as mild.  States nasal congestion is more thick and yellowish and intermittently has blood in nasal drainage from right nare.  Denies hemoptysis.  Denies accompanying chest pain or shortness of breath.  Reports a friend recently with some similar complaints.  States unresolved with over-the-counter congestion medications.  No accompanying known fevers.  Continues to eat and drink well.  Reports continues remain active.  Denies other aggravating or alleviating factors. Denies chest pain, shortness of breath, abdominal pain, dysuria, extremity pain, extremity swelling or rash. Denies recent sickness. Denies recent antibiotic use.    Adin Hector, MD: PCP   Past Medical History:  Diagnosis Date  . Arthritis    JOINT STIFFNESS  . Bilateral carpal tunnel syndrome   . Dysrhythmia    PVC'S  LAST YEAR/ NONE RECENTLY/DR KOWALSKY   . Fibrocystic breast disease   . GERD (gastroesophageal reflux disease)   . Hyperlipidemia   . Hypertension    CONTROLLED ON MEDS  . Neutropenia   . Onychomycosis   . Osteoporosis   . Premature ventricular contractions    DR Mabeline Caras CONSULTED, EKG IN JAN NEXT ONE IN [place]  . Reflux   . Right carotid artery occlusion    /SLIGHT NARROWING  . Thrombocytopenia ([place])    mild    Patient Active Problem List   Diagnosis Date Noted  . Carotid stenosis 06/25/2016  . Chest pain 06/06/2010  . PVD (peripheral vascular disease) ([place]) 06/06/2010  .  Hyperlipidemia 06/06/2010  . HTN (hypertension) 06/06/2010  . PVC (premature ventricular contraction) 06/06/2010    Past Surgical History:  Procedure Laterality Date  . BREAST BIOPSY Left 1991,1992   neg  . CARDIAC CATHETERIZATION  2005   negatvie CAD, right partial carotid artery blockage  . CATARACT EXTRACTION Right 2010  . CATARACT EXTRACTION W/PHACO Left 09/01/2014   Procedure: CATARACT EXTRACTION PHACO AND INTRAOCULAR LENS PLACEMENT (IOC);  Surgeon: Leandrew Koyanagi, MD;  Location: [place];  Service: Ophthalmology;  Laterality: Left;  . COLONOSCOPY WITH PROPOFOL N/A 03/17/2015   Procedure: COLONOSCOPY WITH PROPOFOL;  Surgeon: Manya Silvas, MD;  Location: [facility] ENDOSCOPY;  Service: Endoscopy;  Laterality: N/A;  . PATELLA FRACTURE SURGERY Right 2013   HARDWARE REMOVED 2015 SEPERATE SURGERY     No current facility-administered medications for this encounter.   Current Outpatient Medications:  .  acetaminophen (TYLENOL) 500 MG tablet, Take 500 mg by mouth as needed for mild pain., Disp: , Rfl:  .  aspirin 81 MG EC tablet, Take 81 mg by mouth daily. am, Disp: , Rfl:  .  atorvastatin (LIPITOR) 40 MG tablet, Take 40 mg by mouth at bedtime., Disp: , Rfl:  .  Calcium Citrate-Vitamin D (CALCIUM CITRATE + D3 MAXIMUM) 315-250 MG-UNIT TABS, Take by mouth daily. AM 2 TABS, Disp: , Rfl:  .  Cholecalciferol (VITAMIN D3) 2000 UNITS TABS, Take by mouth. AM, Disp: , Rfl:  .  fluticasone (FLONASE) 50 MCG/ACT nasal spray, Place 2 sprays into the nose as needed. Pm, Disp: , Rfl:  .  hydrochlorothiazide (HYDRODIURIL) 12.5 MG tablet, , Disp: , Rfl:  .  losartan (COZAAR) 100 MG tablet, , Disp: , Rfl:  .  losartan (COZAAR) 50 MG tablet, Take 50 mg by mouth daily. AM, Disp: , Rfl:  .  omeprazole (PRILOSEC) 20 MG capsule, Take 20 mg by mouth daily.  , Disp: , Rfl:  .  amoxicillin-clavulanate (AUGMENTIN) 875-125 MG tablet, Take 1 tablet by mouth every 12 (twelve) hours., Disp: 20 tablet,  Rfl: 0  Allergies Patient has no known allergies.  Family History  Problem Relation Age of Onset  . Heart disease Other   . Hypertension Other   . Hypertension Maternal Aunt   . Breast cancer Maternal Aunt   . Hypertension Paternal Aunt   . Breast cancer Paternal Aunt     Social History Social History   Tobacco Use  . Smoking status: Never Smoker  . Smokeless tobacco: Never Used  Substance Use Topics  . Alcohol use: No  . Drug use: No    Review of Systems Constitutional: No fever/chills Eyes: No visual changes. ENT: No sore throat. As above.  Cardiovascular: Denies chest pain. Respiratory: Denies shortness of breath. Gastrointestinal: No abdominal pain.  Musculoskeletal: Negative for back pain. Skin: Negative for rash.   ____________________________________________   PHYSICAL EXAM:  VITAL SIGNS: ED Triage Vitals  Enc Vitals Group     BP 03/17/17 1127 (!) 161/51     Pulse Rate 03/17/17 1127 86     Resp 03/17/17 1127 16     Temp 03/17/17 1127 97.9 F (36.6 C)     Temp Source 03/17/17 1127 Oral     SpO2 03/17/17 1127 99 %     Weight 03/17/17 1125 210 lb (95.3 kg)     Height 03/17/17 1125 5\' 5"  (1.651 m)     Head Circumference --      Peak Flow --      Pain Score 03/17/17 1125 2     Pain Loc --      Pain Edu? --      Excl. in [place]? --     Constitutional: Alert and oriented. Well appearing and in no acute distress. Eyes: Conjunctivae are normal.  Head: Atraumatic.Mild  tenderness to palpation bilateral frontal and nontender maxillary sinuses. No swelling. No erythema.   Ears: no erythema, normal TMs bilaterally.   Nose: nasal congestion with bilateral nasal turbinate erythema and edema.  No epistaxis.  Minimal dried blood in right nare.  Mouth/Throat: Mucous membranes are moist.  Oropharynx non-erythematous.No tonsillar swelling or exudate.  Neck: No stridor.  No cervical spine tenderness to palpation. Hematological/Lymphatic/Immunilogical: No cervical  lymphadenopathy. Cardiovascular: Normal rate, regular rhythm. Grossly normal heart sounds.  Good peripheral circulation. Respiratory: Normal respiratory effort.  No retractions.  No wheezes, rales or rhonchi. Good air movement.  Musculoskeletal: Ambulatory with steady gait.  Neurologic:  Normal speech and language. No gait instability. Skin:  Skin is warm, dry and intact. No rash noted. Psychiatric: Mood and affect are normal. Speech and behavior are normal.  ___________________________________________   LABS (all labs ordered are listed, but only abnormal results are displayed)  Labs Reviewed - No data to display ____________________________________________   PROCEDURES Procedures    INITIAL IMPRESSION / ASSESSMENT AND PLAN / ED COURSE  Pertinent labs & imaging results that were available during my care of the patient were reviewed by me and considered in my medical decision making (see chart for details).  Well-appearing patient.  No acute distress.  Suspect frontal  sinusitis post recent viral upper respiratory infection.  Encouraged rest, fluids, supportive care and will treat with oral Augmentin.Discussed indication, risks and benefits of medications with patient.  Discussed follow up with Primary care physician this week. Discussed follow up and return parameters including no resolution or any worsening concerns. Patient verbalized understanding and agreed to plan.   ____________________________________________   FINAL CLINICAL IMPRESSION(S) / ED DIAGNOSES  Final diagnoses:  Acute frontal sinusitis, recurrence not specified     ED Discharge Orders        Ordered    amoxicillin-clavulanate (AUGMENTIN) 875-125 MG tablet  Every 12 hours     03/17/17 1159       Note: This dictation was prepared with Dragon dictation along with smaller phrase technology. Any transcriptional errors that result from this process are unintentional.         Marylene Land,  NP 03/17/17 1253

## 2017-03-20 ENCOUNTER — Telehealth: Payer: Self-pay

## 2017-03-20 NOTE — Telephone Encounter (Signed)
Called to follow up with patient since visit here at [facility]. Patient reports slight improvement.  Patient instructed to call back with any questions or concerns. [facility]

## 2017-06-27 ENCOUNTER — Encounter (INDEPENDENT_AMBULATORY_CARE_PROVIDER_SITE_OTHER): Payer: Medicare HMO

## 2017-06-27 ENCOUNTER — Ambulatory Visit (INDEPENDENT_AMBULATORY_CARE_PROVIDER_SITE_OTHER): Payer: Medicare HMO | Admitting: Vascular Surgery

## 2017-07-01 ENCOUNTER — Encounter (INDEPENDENT_AMBULATORY_CARE_PROVIDER_SITE_OTHER): Payer: Self-pay | Admitting: Vascular Surgery

## 2017-07-01 ENCOUNTER — Ambulatory Visit (INDEPENDENT_AMBULATORY_CARE_PROVIDER_SITE_OTHER): Payer: Medicare HMO

## 2017-07-01 ENCOUNTER — Ambulatory Visit (INDEPENDENT_AMBULATORY_CARE_PROVIDER_SITE_OTHER): Payer: Medicare HMO | Admitting: Vascular Surgery

## 2017-07-01 VITALS — BP 130/54 | HR 68 | Resp 17 | Ht 65.0 in | Wt 203.0 lb

## 2017-07-01 DIAGNOSIS — E782 Mixed hyperlipidemia: Secondary | ICD-10-CM | POA: Diagnosis not present

## 2017-07-01 DIAGNOSIS — I10 Essential (primary) hypertension: Secondary | ICD-10-CM

## 2017-07-01 DIAGNOSIS — I739 Peripheral vascular disease, unspecified: Secondary | ICD-10-CM

## 2017-07-01 DIAGNOSIS — I6523 Occlusion and stenosis of bilateral carotid arteries: Secondary | ICD-10-CM

## 2017-07-02 ENCOUNTER — Encounter (INDEPENDENT_AMBULATORY_CARE_PROVIDER_SITE_OTHER): Payer: Self-pay | Admitting: Vascular Surgery

## 2017-07-02 NOTE — Progress Notes (Signed)
MRN : [id number]  Gabrielle Morris is a 77 y.o. (March 30, 1940) female who presents with chief complaint of  Chief Complaint  Patient presents with  . Carotid    39yr follow up  .  History of Present Illness:   The patient is seen for follow up evaluation of carotid stenosis. The carotid stenosis followed by ultrasound.   The patient denies amaurosis fugax. There is no recent history of TIA symptoms or focal motor deficits. There is no prior documented CVA.  The patient is taking enteric-coated aspirin 81 mg daily.  There is no history of migraine headaches. There is no history of seizures.  The patient has a history of coronary artery disease, no recent episodes of angina or shortness of breath. The patient denies PAD or claudication symptoms. There is a history of hyperlipidemia which is being treated with a statin.   Duplex ultrasound of the carotid arteries demonstrates a stable 40 to 59% stenosis of the right internal carotid artery as well as a stable 40 to 59% stenosis of the left internal carotid artery.  There has been no significant change over the past 2 years.   Current Meds  Medication Sig  . acetaminophen (TYLENOL) 500 MG tablet Take 500 mg by mouth as needed for mild pain.  Marland Kitchen aspirin 81 MG EC tablet Take 81 mg by mouth daily. am  . atorvastatin (LIPITOR) 40 MG tablet Take 40 mg by mouth at bedtime.  . Calcium Citrate-Vitamin D (CALCIUM CITRATE + D3 MAXIMUM) 315-250 MG-UNIT TABS Take by mouth daily. AM 2 TABS  . Cholecalciferol (VITAMIN D3) 2000 UNITS TABS Take by mouth. AM  . fluticasone (FLONASE) 50 MCG/ACT nasal spray Place 2 sprays into the nose as needed. Pm  . hydrochlorothiazide (HYDRODIURIL) 12.5 MG tablet   . losartan (COZAAR) 100 MG tablet   . omeprazole (PRILOSEC) 20 MG capsule Take 20 mg by mouth daily.      Past Medical History:  Diagnosis Date  . Arthritis    JOINT STIFFNESS  . Bilateral carpal tunnel syndrome   . Dysrhythmia    PVC'S  LAST YEAR/ NONE RECENTLY/DR KOWALSKY   . Fibrocystic breast disease   . GERD (gastroesophageal reflux disease)   . Hyperlipidemia   . Hypertension    CONTROLLED ON MEDS  . Neutropenia   . Onychomycosis   . Osteoporosis   . Premature ventricular contractions    DR Mabeline Caras CONSULTED, EKG IN JAN NEXT ONE IN [place]  . Reflux   . Right carotid artery occlusion    /SLIGHT NARROWING  . Thrombocytopenia ([place])    mild    Past Surgical History:  Procedure Laterality Date  . BREAST BIOPSY Left 1991,1992   neg  . CARDIAC CATHETERIZATION  2005   negatvie CAD, right partial carotid artery blockage  . CATARACT EXTRACTION Right 2010  . CATARACT EXTRACTION W/PHACO Left 09/01/2014   Procedure: CATARACT EXTRACTION PHACO AND INTRAOCULAR LENS PLACEMENT (IOC);  Surgeon: Leandrew Koyanagi, MD;  Location: [facility];  Service: Ophthalmology;  Laterality: Left;  . COLONOSCOPY WITH PROPOFOL N/A 03/17/2015   Procedure: COLONOSCOPY WITH PROPOFOL;  Surgeon: Manya Silvas, MD;  Location: [facility] ENDOSCOPY;  Service: Endoscopy;  Laterality: N/A;  . PATELLA FRACTURE SURGERY Right 2013   HARDWARE REMOVED 2015 SEPERATE SURGERY    Social History Social History   Tobacco Use  . Smoking status: Never Smoker  . Smokeless tobacco: Never Used  Substance Use Topics  . Alcohol use: No  . Drug  use: No    Family History Family History  Problem Relation Age of Onset  . Heart disease Other   . Hypertension Other   . Hypertension Maternal Aunt   . Breast cancer Maternal Aunt   . Hypertension Paternal Aunt   . Breast cancer Paternal Aunt     No Known Allergies   REVIEW OF SYSTEMS (Negative unless checked)  Constitutional: [] Weight loss  [] Fever  [] Chills Cardiac: [] Chest pain   [] Chest pressure   [] Palpitations   [] Shortness of breath when laying flat   [] Shortness of breath with exertion. Vascular:  [x] Pain in legs with walking   [] Pain in legs at rest  [] History of DVT   [] Phlebitis    [] Swelling in legs   [] Varicose veins   [] Non-healing ulcers Pulmonary:   [] Uses home oxygen   [] Productive cough   [] Hemoptysis   [] Wheeze  [] COPD   [] Asthma Neurologic:  [] Dizziness   [] Seizures   [] History of stroke   [] History of TIA  [] Aphasia   [] Vissual changes   [] Weakness or numbness in arm   [] Weakness or numbness in leg Musculoskeletal:   [] Joint swelling   [] Joint pain   [] Low back pain Hematologic:  [] Easy bruising  [] Easy bleeding   [] Hypercoagulable state   [] Anemic Gastrointestinal:  [] Diarrhea   [] Vomiting  [] Gastroesophageal reflux/heartburn   [] Difficulty swallowing. Genitourinary:  [] Chronic kidney disease   [] Difficult urination  [] Frequent urination   [] Blood in urine Skin:  [] Rashes   [] Ulcers  Psychological:  [] History of anxiety   []  History of major depression.  Physical Examination  Vitals:   07/01/17 1453 07/01/17 1454  BP: (!) 129/59 (!) 130/54  Pulse: 68   Resp: 17   Weight: 203 lb (92.1 kg)   Height: 5\' 5"  (1.651 m)    Body mass index is 33.78 kg/m. Gen: WD/WN, NAD Head: [place]/AT, No temporalis wasting.  Ear/Nose/Throat: Hearing grossly intact, nares w/o erythema or drainage Eyes: PER, EOMI, sclera nonicteric.  Neck: Supple, no large masses.   Pulmonary:  Good air movement, no audible wheezing bilaterally, no use of accessory muscles.  Cardiac: RRR, no JVD Vascular: Bilateral carotid bruits Vessel Right Left  Radial Palpable Palpable  Brachial Palpable Palpable  Carotid Palpable Palpable  PT Not Palpable Not Palpable  DP Not Palpable Not Palpable  Gastrointestinal: Non-distended. No guarding/no peritoneal signs.  Musculoskeletal: M/S 5/5 throughout.  No deformity or atrophy.  Neurologic: CN 2-12 intact. Symmetrical.  Speech is fluent. Motor exam as listed above. Psychiatric: Judgment intact, Mood & affect appropriate for pt's clinical situation. Dermatologic: No rashes or ulcers noted.  No changes consistent with cellulitis. Lymph : No  lichenification or skin changes of chronic lymphedema.  CBC No results found for: WBC, HGB, HCT, MCV, PLT  BMET    Component Value Date/Time   K 3.9 06/01/2013 1345   CrCl cannot be calculated (No order found.).  COAG No results found for: INR, PROTIME  Radiology No results found.   Assessment/Plan 1. Bilateral carotid artery stenosis Recommend:  Given the patient's asymptomatic subcritical stenosis no further invasive testing or surgery at this time.  Duplex ultrasound shows <50% stenosis bilaterally.  Continue antiplatelet therapy as prescribed Continue management of CAD, HTN and Hyperlipidemia Healthy heart diet,  encouraged exercise at least 4 times per week Follow up in 24 months with duplex ultrasound and physical exam based on <50% stenosis of the carotid artery   - VAS US CAROTID; Future  2. PVD (peripheral vascular disease) ([place]) Recommend:  The patient has evidence of atherosclerosis of the lower extremities with claudication.  The patient does not voice lifestyle limiting changes at this point in time.  Noninvasive studies do not suggest clinically significant change.  No invasive studies, angiography or surgery at this time The patient should continue walking and begin a more formal exercise program.  The patient should continue antiplatelet therapy and aggressive treatment of the lipid abnormalities  No changes in the patient's medications at this time  The patient should continue wearing graduated compression socks 10-15 mmHg strength to control the mild edema.    3. Essential hypertension Continue antihypertensive medications as already ordered, these medications have been reviewed and there are no changes at this time.   4. Mixed hyperlipidemia Continue statin as ordered and reviewed, no changes at this time    Hortencia Pilar, MD  07/02/2017 8:06 AM

## 2017-08-05 ENCOUNTER — Other Ambulatory Visit: Payer: Self-pay | Admitting: Internal Medicine

## 2017-08-05 DIAGNOSIS — Z1231 Encounter for screening mammogram for malignant neoplasm of breast: Secondary | ICD-10-CM

## 2017-09-11 ENCOUNTER — Ambulatory Visit
Admission: RE | Admit: 2017-09-11 | Discharge: 2017-09-11 | Disposition: A | Discharge status: Home / Self Care | Payer: Medicare HMO | Source: Ambulatory Visit | Attending: Internal Medicine | Admitting: Internal Medicine

## 2017-09-11 DIAGNOSIS — Z1231 Encounter for screening mammogram for malignant neoplasm of breast: Secondary | ICD-10-CM | POA: Diagnosis not present

## 2017-09-13 ENCOUNTER — Other Ambulatory Visit: Payer: Self-pay | Admitting: Internal Medicine

## 2017-09-13 DIAGNOSIS — N6310 Unspecified lump in the right breast, unspecified quadrant: Secondary | ICD-10-CM

## 2017-09-13 DIAGNOSIS — R928 Other abnormal and inconclusive findings on diagnostic imaging of breast: Secondary | ICD-10-CM

## 2017-09-25 ENCOUNTER — Ambulatory Visit
Admission: RE | Admit: 2017-09-25 | Discharge: 2017-09-25 | Disposition: A | Discharge status: Home / Self Care | Payer: Medicare HMO | Source: Ambulatory Visit | Attending: Internal Medicine | Admitting: Internal Medicine

## 2017-09-25 DIAGNOSIS — N6310 Unspecified lump in the right breast, unspecified quadrant: Secondary | ICD-10-CM

## 2017-09-25 DIAGNOSIS — R928 Other abnormal and inconclusive findings on diagnostic imaging of breast: Secondary | ICD-10-CM | POA: Insufficient documentation

## 2017-10-31 ENCOUNTER — Other Ambulatory Visit: Payer: Self-pay | Admitting: Internal Medicine

## 2017-10-31 DIAGNOSIS — S32010A Wedge compression fracture of first lumbar vertebra, initial encounter for closed fracture: Secondary | ICD-10-CM

## 2017-11-06 ENCOUNTER — Ambulatory Visit
Admission: RE | Admit: 2017-11-06 | Discharge: 2017-11-06 | Disposition: A | Discharge status: Home / Self Care | Payer: Medicare HMO | Source: Ambulatory Visit | Attending: Internal Medicine | Admitting: Internal Medicine

## 2017-11-06 DIAGNOSIS — S32010A Wedge compression fracture of first lumbar vertebra, initial encounter for closed fracture: Secondary | ICD-10-CM | POA: Insufficient documentation

## 2017-11-08 ENCOUNTER — Ambulatory Visit
Admission: RE | Admit: 2017-11-08 | Discharge: 2017-11-08 | Disposition: A | Discharge status: Home / Self Care | Payer: Medicare HMO | Source: Ambulatory Visit | Attending: Orthopedic Surgery | Admitting: Orthopedic Surgery

## 2017-11-08 ENCOUNTER — Encounter
Admission: RE | Disposition: A | Discharge status: Home / Self Care | Payer: Self-pay | Source: Ambulatory Visit | Attending: Orthopedic Surgery

## 2017-11-08 ENCOUNTER — Ambulatory Visit: Payer: Medicare HMO

## 2017-11-08 ENCOUNTER — Other Ambulatory Visit: Payer: Self-pay

## 2017-11-08 ENCOUNTER — Ambulatory Visit: Payer: Medicare HMO | Admitting: Anesthesiology

## 2017-11-08 ENCOUNTER — Encounter: Payer: Self-pay | Admitting: *Deleted

## 2017-11-08 DIAGNOSIS — E785 Hyperlipidemia, unspecified: Secondary | ICD-10-CM | POA: Diagnosis not present

## 2017-11-08 DIAGNOSIS — K219 Gastro-esophageal reflux disease without esophagitis: Secondary | ICD-10-CM | POA: Diagnosis not present

## 2017-11-08 DIAGNOSIS — Z7982 Long term (current) use of aspirin: Secondary | ICD-10-CM | POA: Insufficient documentation

## 2017-11-08 DIAGNOSIS — Z7951 Long term (current) use of inhaled steroids: Secondary | ICD-10-CM | POA: Insufficient documentation

## 2017-11-08 DIAGNOSIS — I739 Peripheral vascular disease, unspecified: Secondary | ICD-10-CM | POA: Insufficient documentation

## 2017-11-08 DIAGNOSIS — I10 Essential (primary) hypertension: Secondary | ICD-10-CM | POA: Diagnosis not present

## 2017-11-08 DIAGNOSIS — Z79899 Other long term (current) drug therapy: Secondary | ICD-10-CM | POA: Diagnosis not present

## 2017-11-08 DIAGNOSIS — W109XXA Fall (on) (from) unspecified stairs and steps, initial encounter: Secondary | ICD-10-CM | POA: Insufficient documentation

## 2017-11-08 DIAGNOSIS — S32010A Wedge compression fracture of first lumbar vertebra, initial encounter for closed fracture: Secondary | ICD-10-CM | POA: Diagnosis present

## 2017-11-08 DIAGNOSIS — Z419 Encounter for procedure for purposes other than remedying health state, unspecified: Secondary | ICD-10-CM

## 2017-11-08 HISTORY — PX: KYPHOPLASTY: SHX5884

## 2017-11-08 LAB — CBC
HCT: 37.4 % (ref 35.0–47.0)
Hemoglobin: 13.2 g/dL (ref 12.0–16.0)
MCH: 31.2 pg (ref 26.0–34.0)
MCHC: 35.1 g/dL (ref 32.0–36.0)
MCV: 88.8 fL (ref 80.0–100.0)
PLATELETS: 222 10*3/uL (ref 150–440)
RBC: 4.22 MIL/uL (ref 3.80–5.20)
RDW: 13.4 % (ref 11.5–14.5)
WBC: 9.5 10*3/uL (ref 3.6–11.0)

## 2017-11-08 LAB — BASIC METABOLIC PANEL
Anion gap: 7 (ref 5–15)
BUN: 15 mg/dL (ref 8–23)
CO2: 29 mmol/L (ref 22–32)
CREATININE: 0.72 mg/dL (ref 0.44–1.00)
Calcium: 9.3 mg/dL (ref 8.9–10.3)
Chloride: 98 mmol/L (ref 98–111)
GFR calc Af Amer: >60 mL/min (ref >60)
GLUCOSE: 101 mg/dL — AB (ref 70–99)
POTASSIUM: 3.2 mmol/L — AB (ref 3.5–5.1)
SODIUM: 134 mmol/L — AB (ref 135–145)

## 2017-11-08 SURGERY — KYPHOPLASTY
Anesthesia: General

## 2017-11-08 MED ORDER — ONDANSETRON HCL 4 MG/2ML IJ SOLN: 4.0000 mg | Freq: Four times a day (QID) | INTRAMUSCULAR | Status: DC | PRN

## 2017-11-08 MED ORDER — IOPAMIDOL (ISOVUE-M 200) INJECTION 41%
INTRAMUSCULAR | Status: AC
  Filled 2017-11-08: qty 20

## 2017-11-08 MED ORDER — KETAMINE HCL 50 MG/ML IJ SOLN
INTRAMUSCULAR | Status: AC
  Filled 2017-11-08: qty 10

## 2017-11-08 MED ORDER — MIDAZOLAM HCL 2 MG/2ML IJ SOLN
INTRAMUSCULAR | Status: AC
  Filled 2017-11-08: qty 2

## 2017-11-08 MED ORDER — CEFAZOLIN SODIUM-DEXTROSE 2-4 GM/100ML-% IV SOLN
2.0000 g | Freq: Once | INTRAVENOUS | Status: AC
  Administered 2017-11-08: 2 g via INTRAVENOUS

## 2017-11-08 MED ORDER — TRAMADOL HCL 50 MG PO TABS
ORAL_TABLET | ORAL | Status: AC
  Filled 2017-11-08: qty 1

## 2017-11-08 MED ORDER — LACTATED RINGERS IV SOLN
INTRAVENOUS | Status: DC
  Administered 2017-11-08: 11:00:00 via INTRAVENOUS

## 2017-11-08 MED ORDER — LIDOCAINE HCL (PF) 1 % IJ SOLN
INTRAMUSCULAR | Status: DC | PRN
  Administered 2017-11-08: 30 mL

## 2017-11-08 MED ORDER — OXYCODONE HCL 5 MG/5ML PO SOLN: 5.0000 mg | Freq: Once | ORAL | Status: DC | PRN

## 2017-11-08 MED ORDER — PROPOFOL 10 MG/ML IV BOLUS
INTRAVENOUS | Status: AC
  Filled 2017-11-08: qty 20

## 2017-11-08 MED ORDER — SODIUM CHLORIDE 0.9 % IV SOLN
INTRAVENOUS | Status: DC | PRN
  Administered 2017-11-08: 12:00:00 via INTRAVENOUS

## 2017-11-08 MED ORDER — LIDOCAINE HCL (PF) 1 % IJ SOLN
INTRAMUSCULAR | Status: AC
  Filled 2017-11-08: qty 30

## 2017-11-08 MED ORDER — TRAMADOL HCL 50 MG PO TABS: 50.0000 mg | ORAL_TABLET | Freq: Four times a day (QID) | ORAL | 0 refills | Status: AC | PRN

## 2017-11-08 MED ORDER — OXYCODONE HCL 5 MG PO TABS: 5.0000 mg | ORAL_TABLET | Freq: Once | ORAL | Status: DC | PRN

## 2017-11-08 MED ORDER — FENTANYL CITRATE (PF) 100 MCG/2ML IJ SOLN: 25.0000 ug | INTRAMUSCULAR | Status: DC | PRN

## 2017-11-08 MED ORDER — METOCLOPRAMIDE HCL 5 MG/ML IJ SOLN: 5.0000 mg | Freq: Three times a day (TID) | INTRAMUSCULAR | Status: DC | PRN

## 2017-11-08 MED ORDER — PROPOFOL 500 MG/50ML IV EMUL
INTRAVENOUS | Status: DC | PRN
  Administered 2017-11-08: 40 ug/kg/min via INTRAVENOUS

## 2017-11-08 MED ORDER — HYDROCODONE-ACETAMINOPHEN 5-325 MG PO TABS: 1.0000 | ORAL_TABLET | ORAL | Status: DC | PRN

## 2017-11-08 MED ORDER — BUPIVACAINE-EPINEPHRINE (PF) 0.5% -1:200000 IJ SOLN
INTRAMUSCULAR | Status: AC
  Filled 2017-11-08: qty 30

## 2017-11-08 MED ORDER — KETAMINE HCL 50 MG/ML IJ SOLN
INTRAMUSCULAR | Status: DC | PRN
  Administered 2017-11-08 (×2): 25 mg via INTRAMUSCULAR

## 2017-11-08 MED ORDER — BUPIVACAINE-EPINEPHRINE (PF) 0.5% -1:200000 IJ SOLN
INTRAMUSCULAR | Status: DC | PRN
  Administered 2017-11-08: 20 mL

## 2017-11-08 MED ORDER — METOCLOPRAMIDE HCL 10 MG PO TABS: 5.0000 mg | ORAL_TABLET | Freq: Three times a day (TID) | ORAL | Status: DC | PRN

## 2017-11-08 MED ORDER — TRAMADOL HCL 50 MG PO TABS
50.0000 mg | ORAL_TABLET | Freq: Four times a day (QID) | ORAL | Status: DC
  Administered 2017-11-08: 50 mg via ORAL

## 2017-11-08 MED ORDER — MIDAZOLAM HCL 2 MG/2ML IJ SOLN
INTRAMUSCULAR | Status: DC | PRN
  Administered 2017-11-08: 2 mg via INTRAVENOUS

## 2017-11-08 MED ORDER — SODIUM CHLORIDE 0.9 % IV SOLN: INTRAVENOUS | Status: DC

## 2017-11-08 MED ORDER — ONDANSETRON HCL 4 MG PO TABS: 4.0000 mg | ORAL_TABLET | Freq: Four times a day (QID) | ORAL | Status: DC | PRN

## 2017-11-08 MED ORDER — CEFAZOLIN SODIUM-DEXTROSE 2-4 GM/100ML-% IV SOLN
INTRAVENOUS | Status: AC
  Filled 2017-11-08: qty 100

## 2017-11-08 SURGICAL SUPPLY — 17 items
ADH SKN CLS APL DERMABOND .7 (GAUZE/BANDAGES/DRESSINGS) ×1
CEMENT KYPHON CX01A KIT/MIXER (Cement) ×3 IMPLANT
DERMABOND ADVANCED (GAUZE/BANDAGES/DRESSINGS) ×2
DERMABOND ADVANCED .7 DNX12 (GAUZE/BANDAGES/DRESSINGS) ×1 IMPLANT
DEVICE BIOPSY BONE KYPHX (INSTRUMENTS) ×3 IMPLANT
DRAPE C-ARM XRAY 36X54 (DRAPES) ×3 IMPLANT
DURAPREP 26ML APPLICATOR (WOUND CARE) ×3 IMPLANT
GLOVE SURG SYN 9.0  PF PI (GLOVE) ×2
GLOVE SURG SYN 9.0 PF PI (GLOVE) ×1 IMPLANT
GOWN SRG 2XL LVL 4 RGLN SLV (GOWNS) ×1 IMPLANT
GOWN STRL NON-REIN 2XL LVL4 (GOWNS) ×3
GOWN STRL REUS W/ TWL LRG LVL3 (GOWN DISPOSABLE) ×1 IMPLANT
GOWN STRL REUS W/TWL LRG LVL3 (GOWN DISPOSABLE) ×3
PACK KYPHOPLASTY (MISCELLANEOUS) ×3 IMPLANT
STRAP SAFETY 5IN WIDE (MISCELLANEOUS) ×3 IMPLANT
TRAY KYPHOPAK 15/3 EXPRESS 1ST (MISCELLANEOUS) ×1 IMPLANT
TRAY KYPHOPAK 20/3 EXPRESS 1ST (MISCELLANEOUS) ×3 IMPLANT

## 2017-11-08 NOTE — H&P (Signed)
Reviewed paper H+P, will be scanned into chart. No changes noted. L1 compression fracture

## 2017-11-08 NOTE — Op Note (Signed)
11/08/2017  12:45 PM  PATIENT:  Gabrielle Morris  77 y.o. female  PRE-OPERATIVE DIAGNOSIS:  L1 wedge compression fracture  POST-OPERATIVE DIAGNOSIS:  L 1 wedge compression fracture  PROCEDURE:  Procedure(s): KYPHOPLASTY-L1 (N/A)  SURGEON: Laurene Footman, MD  ASSISTANTS: None  ANESTHESIA:   local and MAC  EBL:  Total I/O In: 600 [I.V.:600] Out: 5 [Blood:5]  BLOOD ADMINISTERED:none  DRAINS: none   LOCAL MEDICATIONS USED:  MARCAINE    and LIDOCAINE   SPECIMEN:  No Specimen  DISPOSITION OF SPECIMEN:  N/A  COUNTS:  YES  TOURNIQUET:  * No tourniquets in log *  IMPLANTS: Bone cement  DICTATION: .Dragon Dictation patient was brought the operating room and after adequate anesthesia was given the patient was placed prone.  AP and lateral imaging was obtained that showed good visualization of the affected L1 vertebral body.  After patient identification and timeout procedures were completed the skin was prepped with alcohol and 5 cc 1% Xylocaine is infiltrated on both sides at L1.  The back was then prepped and draped you sterile fashion and repeat timeout procedure carried out.  Spinal needle was used to get local anesthetic down to the pedicle on the right side.  After allowing this to set a small incision was made and trocar advanced into the vertebral body using a para pedicular approach.  Biopsy was obtained followed by drilling and inflation of the balloon to 4 cc.  When the cement was the appropriate consistency the balloon was removed and approximately 5 and three-quarter cc of bone cement placed that gave very good fill to the vertebral body crossing the midline getting along the superior endplate where the fracture was most visible and in the center portion from inferior to superior endplates.  When the cemented set the trochars removed and permanent serum views obtained.  Dermabond used to close the skin followed by Band-Aid  PLAN OF CARE: Discharge to home after  PACU  PATIENT DISPOSITION:  PACU - hemodynamically stable.

## 2017-11-08 NOTE — OR Nursing (Signed)
Dr. Rudene Christians into see patient and after viewing the scan reports level to operate on is L1. He has asked me to correct this on the consent. This change has been made.

## 2017-11-08 NOTE — Discharge Instructions (Addendum)
Take it easy today and tomorrow, resume more normal activities on Sunday.  Remove Band-Aid on Sunday then okay to shower.  Tramadol as directed.  AMBULATORY SURGERY  DISCHARGE INSTRUCTIONS   1) The drugs that you were given will stay in your system until tomorrow so for the next 24 hours you should not:  A) Drive an automobile B) Make any legal decisions C) Drink any alcoholic beverage   2) You may resume regular meals tomorrow.  Today it is better to start with liquids and gradually work up to solid foods.  You may eat anything you prefer, but it is better to start with liquids, then soup and crackers, and gradually work up to solid foods.   3) Please notify your doctor immediately if you have any unusual bleeding, trouble breathing, redness and pain at the surgery site, drainage, fever, or pain not relieved by medication.    4) Additional Instructions:        Please contact your physician with any problems or Same Day Surgery at [phone], Monday through Friday 6 am to 4 pm, or [place] [facility] number at [phone].

## 2017-11-08 NOTE — OR Nursing (Signed)
Discharge instructions discussed with pt and daughter. Both voice understanding. 

## 2017-11-08 NOTE — Anesthesia Postprocedure Evaluation (Signed)
Anesthesia Post Note  Patient: Gabrielle Morris  Procedure(s) Performed: Anne Ng (N/A )  Patient location during evaluation: PACU Anesthesia Type: General Level of consciousness: awake and alert Pain management: pain level controlled Vital Signs Assessment: post-procedure vital signs reviewed and stable Respiratory status: spontaneous breathing, nonlabored ventilation, respiratory function stable and patient connected to nasal cannula oxygen Cardiovascular status: blood pressure returned to baseline and stable Postop Assessment: no apparent nausea or vomiting Anesthetic complications: no     Last Vitals:  Vitals:   11/08/17 1310 11/08/17 1322  BP: (!) 168/61 (!) 152/61  Pulse: 75 98  Resp: 12 16  Temp: (!) 36.4 C (!) 36.4 C  SpO2: 99% 100%    Last Pain:  Vitals:   11/08/17 1344  TempSrc:   PainSc: 5                  Precious Haws Piscitello

## 2017-11-08 NOTE — Anesthesia Preprocedure Evaluation (Signed)
Anesthesia Evaluation  Patient identified by MRN, date of birth, ID band Patient awake    Reviewed: Allergy & Precautions, H&P , NPO status , Patient's Chart, lab work & pertinent test results  History of Anesthesia Complications Negative for: history of anesthetic complications  Airway Mallampati: III  TM Distance: <3 FB Neck ROM: limited    Dental  (+) Chipped, Poor Dentition   Pulmonary neg pulmonary ROS, neg shortness of breath,           Cardiovascular Exercise Tolerance: Good hypertension, (-) angina+ Peripheral Vascular Disease  (-) Past MI and (-) DOE + dysrhythmias      Neuro/Psych  Neuromuscular disease negative psych ROS   GI/Hepatic Neg liver ROS, GERD  Medicated and Controlled,  Endo/Other  negative endocrine ROS  Renal/GU negative Renal ROS  negative genitourinary   Musculoskeletal  (+) Arthritis ,   Abdominal   Peds  Hematology negative hematology ROS (+)   Anesthesia Other Findings Past Medical History: No date: Arthritis     Comment:  JOINT STIFFNESS No date: Bilateral carpal tunnel syndrome No date: Dysrhythmia     Comment:  PVC'S  LAST YEAR/ NONE RECENTLY/DR KOWALSKY  No date: Fibrocystic breast disease No date: GERD (gastroesophageal reflux disease) No date: Hyperlipidemia No date: Hypertension     Comment:  CONTROLLED ON MEDS No date: Neutropenia No date: Onychomycosis No date: Osteoporosis No date: Premature ventricular contractions     Comment:  DR Mabeline Caras CONSULTED, EKG IN JAN NEXT ONE IN JULY No date: Reflux No date: Right carotid artery occlusion     Comment:  /SLIGHT NARROWING No date: Thrombocytopenia ([place])     Comment:  mild  Past Surgical History: 5176,1607: BREAST EXCISIONAL BIOPSY; Left     Comment:  neg 2005: CARDIAC CATHETERIZATION     Comment:  negatvie CAD, right partial carotid artery blockage 2010: CATARACT EXTRACTION; Right 09/01/2014: CATARACT EXTRACTION  W/PHACO; Left     Comment:  Procedure: CATARACT EXTRACTION PHACO AND INTRAOCULAR               LENS PLACEMENT (IOC);  Surgeon: Leandrew Koyanagi, MD;               Location: [facility];  Service: Ophthalmology;                Laterality: Left; 03/17/2015: COLONOSCOPY WITH PROPOFOL; N/A     Comment:  Procedure: COLONOSCOPY WITH PROPOFOL;  Surgeon: Manya Silvas, MD;  Location: [facility] ENDOSCOPY;  Service:               Endoscopy;  Laterality: N/A; 2013: PATELLA FRACTURE SURGERY; Right     Comment:  HARDWARE REMOVED 2015 SEPERATE SURGERY  BMI    Body Mass Index:  33.79 kg/m      Reproductive/Obstetrics negative OB ROS                             Anesthesia Physical Anesthesia Plan  ASA: III  Anesthesia Plan: General   Post-op Pain Management:    Induction: Intravenous  PONV Risk Score and Plan: Propofol infusion and TIVA  Airway Management Planned: Natural Airway and Nasal Cannula  Additional Equipment:   Intra-op Plan:   Post-operative Plan:   Informed Consent: I have reviewed the patients History and Physical, chart, labs and discussed the procedure including the risks, benefits and alternatives for the  proposed anesthesia with the patient or authorized representative who has indicated his/her understanding and acceptance.   Dental Advisory Given  Plan Discussed with: Anesthesiologist, CRNA and Surgeon  Anesthesia Plan Comments: (Patient consented for risks of anesthesia including but not limited to:  - adverse reactions to medications - risk of intubation if required - damage to teeth, lips or other oral mucosa - sore throat or hoarseness - Damage to heart, brain, lungs or loss of life  Patient voiced understanding.)        Anesthesia Quick Evaluation

## 2017-11-08 NOTE — Anesthesia Post-op Follow-up Note (Signed)
Anesthesia QCDR form completed.        

## 2017-11-08 NOTE — Transfer of Care (Signed)
Immediate Anesthesia Transfer of Care Note  Patient: Gabrielle Morris  Procedure(s) Performed: Anne Ng (N/A )  Patient Location: PACU  Anesthesia Type:General  Level of Consciousness: awake, alert  and oriented  Airway & Oxygen Therapy: Patient Spontanous Breathing and Patient connected to nasal cannula oxygen  Post-op Assessment: Report given to RN and Post -op Vital signs reviewed and stable  Post vital signs: Reviewed and stable  Last Vitals:  Vitals Value Taken Time  BP    Temp    Pulse    Resp    SpO2      Last Pain:  Vitals:   11/08/17 1000  TempSrc: Oral  PainSc: 5          Complications: No apparent anesthesia complications

## 2018-08-18 ENCOUNTER — Other Ambulatory Visit: Payer: Self-pay | Admitting: Internal Medicine

## 2018-08-18 DIAGNOSIS — Z1231 Encounter for screening mammogram for malignant neoplasm of breast: Secondary | ICD-10-CM

## 2018-09-29 ENCOUNTER — Ambulatory Visit
Admission: RE | Admit: 2018-09-29 | Discharge: 2018-09-29 | Disposition: A | Discharge status: Home / Self Care | Payer: Medicare HMO | Source: Ambulatory Visit | Attending: Internal Medicine | Admitting: Internal Medicine

## 2018-09-29 ENCOUNTER — Other Ambulatory Visit: Payer: Self-pay

## 2018-09-29 DIAGNOSIS — Z1231 Encounter for screening mammogram for malignant neoplasm of breast: Secondary | ICD-10-CM | POA: Insufficient documentation

## 2019-07-02 ENCOUNTER — Ambulatory Visit (INDEPENDENT_AMBULATORY_CARE_PROVIDER_SITE_OTHER): Payer: Medicare HMO | Admitting: Vascular Surgery

## 2019-07-02 ENCOUNTER — Other Ambulatory Visit: Payer: Self-pay

## 2019-07-02 ENCOUNTER — Ambulatory Visit (INDEPENDENT_AMBULATORY_CARE_PROVIDER_SITE_OTHER): Payer: Medicare HMO

## 2019-07-02 ENCOUNTER — Encounter (INDEPENDENT_AMBULATORY_CARE_PROVIDER_SITE_OTHER): Payer: Self-pay | Admitting: Vascular Surgery

## 2019-07-02 VITALS — BP 111/67 | HR 70 | Resp 16 | Wt 203.6 lb

## 2019-07-02 DIAGNOSIS — I6523 Occlusion and stenosis of bilateral carotid arteries: Secondary | ICD-10-CM | POA: Diagnosis not present

## 2019-07-02 DIAGNOSIS — I739 Peripheral vascular disease, unspecified: Secondary | ICD-10-CM | POA: Diagnosis not present

## 2019-07-02 DIAGNOSIS — I10 Essential (primary) hypertension: Secondary | ICD-10-CM | POA: Diagnosis not present

## 2019-07-02 DIAGNOSIS — E782 Mixed hyperlipidemia: Secondary | ICD-10-CM

## 2019-07-02 NOTE — Progress Notes (Signed)
MRN : [id number]  Gabrielle Morris is a 79 y.o. ([birth year]) female who presents with chief complaint of  Chief Complaint  Patient presents with  . Follow-up    17yr ultrasound follow up  .  History of Present Illness:   The patient is seen for follow up evaluation of carotid stenosis. The carotid stenosis followed by ultrasound.   The patient denies amaurosis fugax. There is no recent history of TIA symptoms or focal motor deficits. There is no prior documented CVA.  The patient is taking enteric-coated aspirin 81 mg daily.  There is no history of migraine headaches. There is no history of seizures.  The patient has a history of coronary artery disease, no recent episodes of angina or shortness of breath. The patient denies PAD or claudication symptoms. There is a history of hyperlipidemia which is being treated with a statin.  Duplex ultrasound of the carotid arteries demonstrates a stable 40 to 59% stenosis of the right internal carotid artery as well as an increase in the left ICA to 60-79% stenosis.    Current Meds  Medication Sig  . acetaminophen (TYLENOL) 500 MG tablet Take 500 mg by mouth every 6 (six) hours as needed for mild pain.   Marland Kitchen aspirin 81 MG EC tablet Take 81 mg by mouth daily. am  . atorvastatin (LIPITOR) 40 MG tablet Take 40 mg by mouth at bedtime.  . Calcium Citrate-Vitamin D (CALCIUM CITRATE + D3 MAXIMUM) 315-250 MG-UNIT TABS Take 1 tablet by mouth daily.   . Cholecalciferol (VITAMIN D3) 2000 UNITS TABS Take 2,000 Units by mouth every morning. AM   . fluticasone (FLONASE) 50 MCG/ACT nasal spray Place 2 sprays into the nose as needed for allergies. Pm  . hydrochlorothiazide (HYDRODIURIL) 12.5 MG tablet Take 12.5 mg by mouth daily.   Marland Kitchen losartan (COZAAR) 100 MG tablet Take 100 mg by mouth daily.   Marland Kitchen omeprazole (PRILOSEC) 20 MG capsule Take 20 mg by mouth daily.      Past Medical History:  Diagnosis Date  . Arthritis    JOINT STIFFNESS  .  Bilateral carpal tunnel syndrome   . Dysrhythmia    PVC'S  LAST YEAR/ NONE RECENTLY/DR KOWALSKY   . Fibrocystic breast disease   . GERD (gastroesophageal reflux disease)   . Hyperlipidemia   . Hypertension    CONTROLLED ON MEDS  . Neutropenia   . Onychomycosis   . Osteoporosis   . Premature ventricular contractions    DR Mabeline Caras CONSULTED, EKG IN JAN NEXT ONE IN [place]  . Reflux   . Right carotid artery occlusion    /SLIGHT NARROWING  . Thrombocytopenia ([place])    mild    Past Surgical History:  Procedure Laterality Date  . BREAST EXCISIONAL BIOPSY Left 1991,1992   neg  . CARDIAC CATHETERIZATION  2005   negatvie CAD, right partial carotid artery blockage  . CATARACT EXTRACTION Right 2010  . CATARACT EXTRACTION W/PHACO Left 09/01/2014   Procedure: CATARACT EXTRACTION PHACO AND INTRAOCULAR LENS PLACEMENT (IOC);  Surgeon: Leandrew Koyanagi, MD;  Location: [place];  Service: Ophthalmology;  Laterality: Left;  . COLONOSCOPY WITH PROPOFOL N/A 03/17/2015   Procedure: COLONOSCOPY WITH PROPOFOL;  Surgeon: Manya Silvas, MD;  Location: [facility] ENDOSCOPY;  Service: Endoscopy;  Laterality: N/A;  . KYPHOPLASTY N/A 11/08/2017   Procedure: [id number];  Surgeon: Hessie Knows, MD;  Location: [facility] ORS;  Service: Orthopedics;  Laterality: N/A;  . PATELLA FRACTURE SURGERY Right 2013   HARDWARE REMOVED 2015 SEPERATE  SURGERY    Social History Social History   Tobacco Use  . Smoking status: Never Smoker  . Smokeless tobacco: Never Used  Substance Use Topics  . Alcohol use: No  . Drug use: No    Family History Family History  Problem Relation Age of Onset  . Heart disease Other   . Hypertension Other   . Hypertension Maternal Aunt   . Breast cancer Maternal Aunt   . Hypertension Paternal Aunt   . Breast cancer Paternal Aunt     No Known Allergies   REVIEW OF SYSTEMS (Negative unless checked)  Constitutional: [] Weight loss  [] Fever  [] Chills Cardiac: [] Chest pain    [] Chest pressure   [] Palpitations   [] Shortness of breath when laying flat   [] Shortness of breath with exertion. Vascular:  [] Pain in legs with walking   [] Pain in legs at rest  [] History of DVT   [] Phlebitis   [] Swelling in legs   [] Varicose veins   [] Non-healing ulcers Pulmonary:   [] Uses home oxygen   [] Productive cough   [] Hemoptysis   [] Wheeze  [] COPD   [] Asthma Neurologic:  [] Dizziness   [] Seizures   [] History of stroke   [] History of TIA  [] Aphasia   [] Vissual changes   [] Weakness or numbness in arm   [] Weakness or numbness in leg Musculoskeletal:   [] Joint swelling   [] Joint pain   [] Low back pain Hematologic:  [] Easy bruising  [] Easy bleeding   [] Hypercoagulable state   [] Anemic Gastrointestinal:  [] Diarrhea   [] Vomiting  [] Gastroesophageal reflux/heartburn   [] Difficulty swallowing. Genitourinary:  [] Chronic kidney disease   [] Difficult urination  [] Frequent urination   [] Blood in urine Skin:  [] Rashes   [] Ulcers  Psychological:  [] History of anxiety   []  History of major depression.  Physical Examination  Vitals:   07/02/19 1434  BP: 111/67  Pulse: 70  Resp: 16  Weight: 203 lb 9.6 oz (92.4 kg)   Body mass index is 33.88 kg/m. Gen: WD/WN, NAD Head: [place]/AT, No temporalis wasting.  Ear/Nose/Throat: Hearing grossly intact, nares w/o erythema or drainage Eyes: PER, EOMI, sclera nonicteric.  Neck: Supple, no large masses.   Pulmonary:  Good air movement, no audible wheezing bilaterally, no use of accessory muscles.  Cardiac: RRR, no JVD Vascular: left carotid bruit Vessel Right Left  Radial Palpable Palpable  Brachial Palpable Palpable  Carotid Palpable Palpable  Gastrointestinal: Non-distended. No guarding/no peritoneal signs.  Musculoskeletal: M/S 5/5 throughout.  No deformity or atrophy.  Neurologic: CN 2-12 intact. Symmetrical.  Speech is fluent. Motor exam as listed above. Psychiatric: Judgment intact, Mood & affect appropriate for pt's clinical  situation. Dermatologic: No rashes or ulcers noted.  No changes consistent with cellulitis. Lymph : No lichenification or skin changes of chronic lymphedema.  CBC Lab Results  Component Value Date   WBC 9.5 11/08/2017   HGB 13.2 11/08/2017   HCT 37.4 11/08/2017   MCV 88.8 11/08/2017   PLT 222 11/08/2017    BMET    Component Value Date/Time   NA 134 (L) 11/08/2017 1031   K 3.2 (L) 11/08/2017 1031   K 3.9 06/01/2013 1345   CL 98 11/08/2017 1031   CO2 29 11/08/2017 1031   GLUCOSE 101 (H) 11/08/2017 1031   BUN 15 11/08/2017 1031   CREATININE 0.72 11/08/2017 1031   CALCIUM 9.3 11/08/2017 1031   GFRNONAA >60 11/08/2017 1031   GFRAA >60 11/08/2017 1031   CrCl cannot be calculated (Patient's most recent lab result is older than the  maximum 21 days allowed.).  COAG No results found for: INR, PROTIME  Radiology No results found.   Assessment/Plan 1. Bilateral carotid artery stenosis Recommend:  Given the patient's asymptomatic subcritical stenosis no further invasive testing or surgery at this time.  Duplex ultrasound shows RICA 123456 and LICA [id number].  Continue antiplatelet therapy as prescribed Continue management of CAD, HTN and Hyperlipidemia Healthy heart diet,  encouraged exercise at least 4 times per week Follow up in 12 months with duplex ultrasound and physical exam   - VAS US CAROTID; Future  2. PVD (peripheral vascular disease) ([place]) Recommend:  I do not find evidence of life style limiting vascular disease. The patient specifically denies life style limitation.  Previous noninvasive studies including ABI's of the legs do not identify critical vascular problems.  The patient should continue walking and begin a more formal exercise program. The patient should continue his antiplatelet therapy and aggressive treatment of the lipid abnormalities.  The patient should begin wearing graduated compression socks 15-20 mmHg strength to control her mild  edema.  3. Mixed hyperlipidemia Continue statin as ordered and reviewed, no changes at this time   4. Essential hypertension Continue antihypertensive medications as already ordered, these medications have been reviewed and there are no changes at this time.     Hortencia Pilar, MD  07/02/2019 2:43 PM

## 2019-08-04 IMAGING — XA DG C-ARM 61-120 MIN
3 series · 3 of 3 positions shown · non-contrast
Comparison: 11/06/2017

CLINICAL DATA: L1 kyphoplasty

EXAM:
LUMBAR SPINE - 2-3 VIEW; DG C-ARM 61-120 MIN

[Series 1: ortho standard · 1 of 1 slices shown (1 of 3)]
[im 1/1]
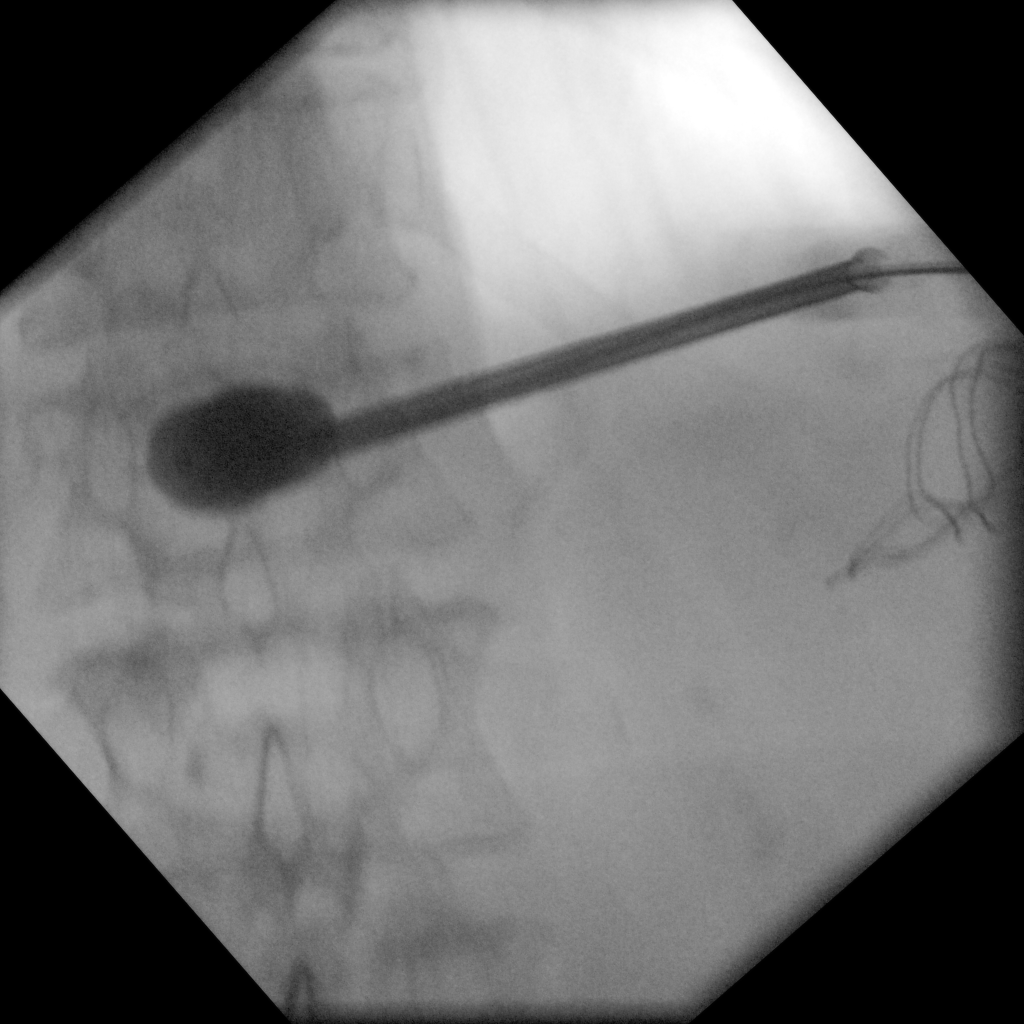

[Series 2: ortho standard · 1 of 1 slices shown (2 of 3)]
[im 1/1]
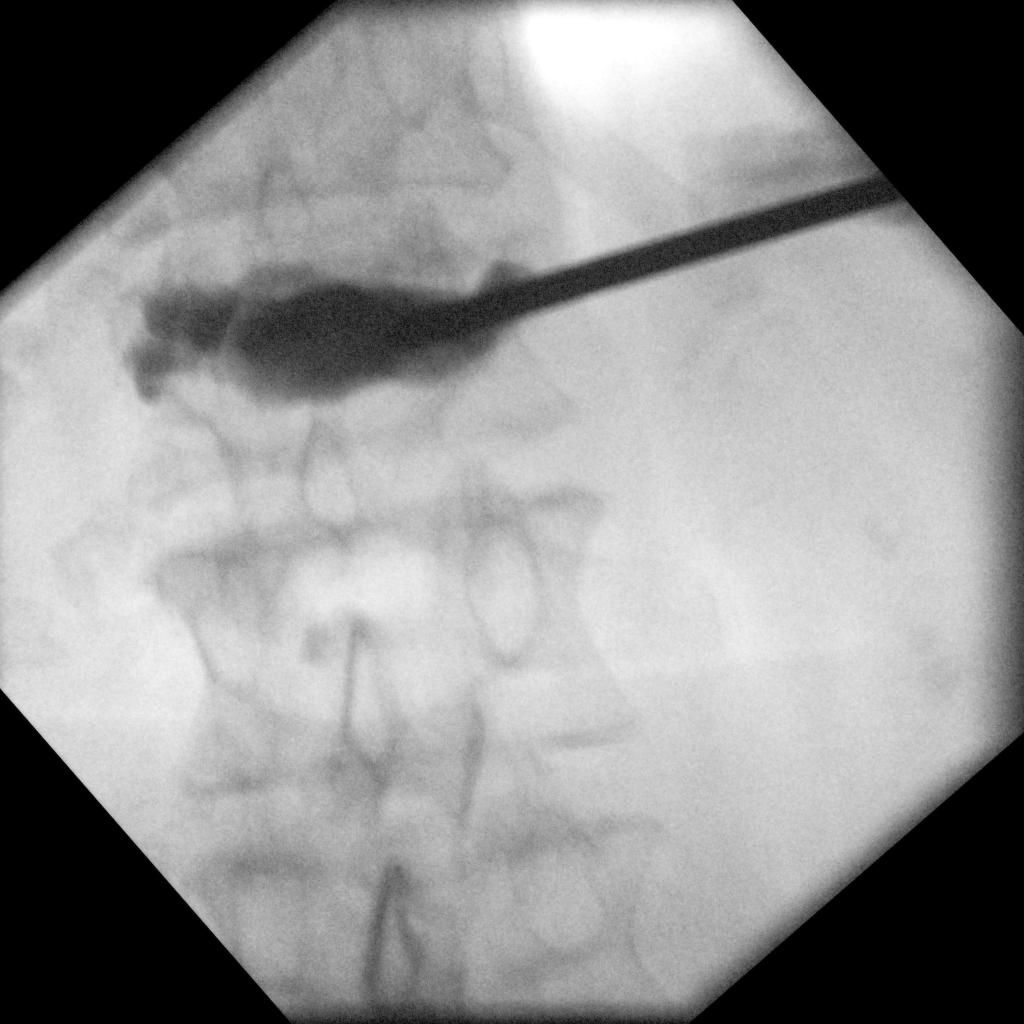

[Series 3: ortho standard · 1 of 1 slices shown (3 of 3)]
[im 1/1]
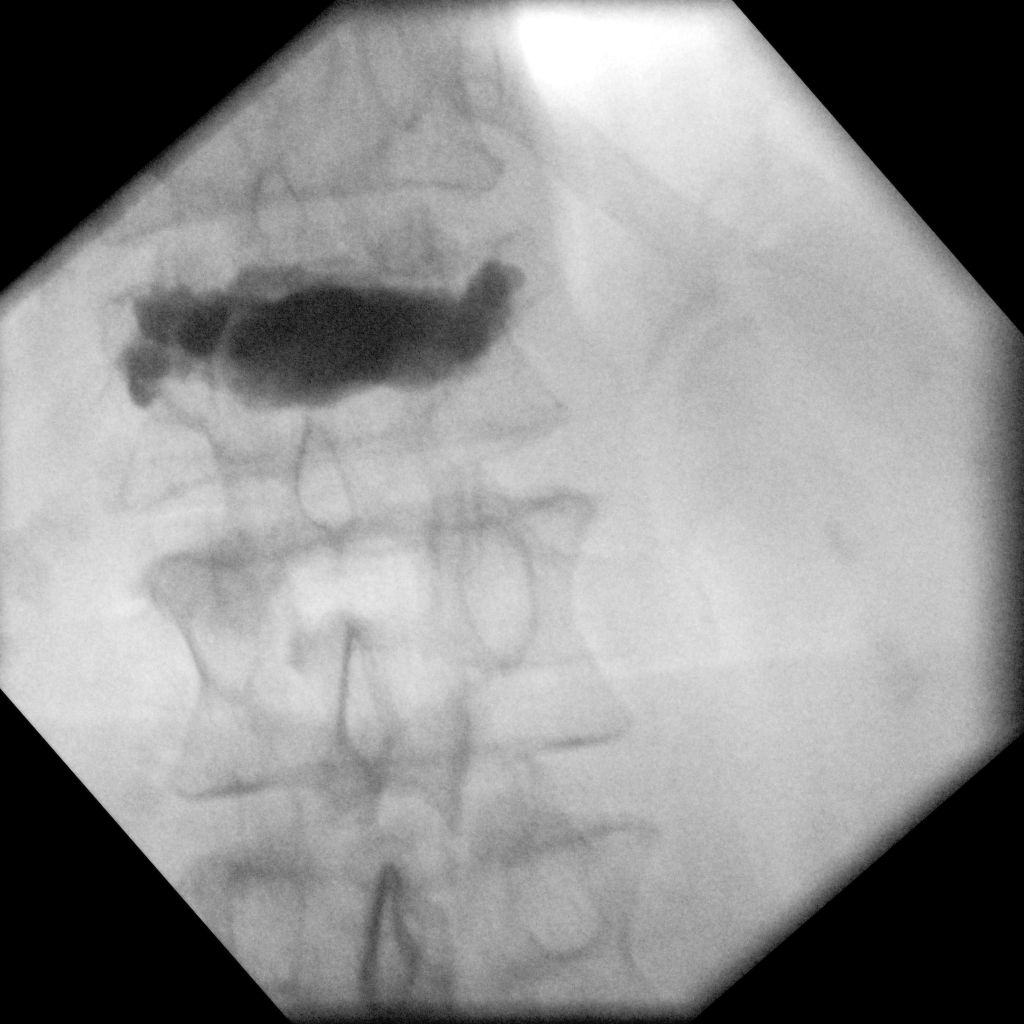

[3 of 3 positions shown; findings below may reference images not displayed]

FLUOROSCOPY TIME:  Radiation Exposure Index (as provided by the
fluoroscopic device): 14.06 mGy

If the device does not provide the exposure index:

Fluoroscopy Time:  40 seconds

Number of Acquired Images:  6
FINDINGS: Spot films demonstrate evidence of unilateral transpedicular
cannulation of the L1 vertebral body with subsequent balloon
inflation and deposition of contrast laden cement throughout the
fracture line superiorly.
IMPRESSION: L1 kyphoplasty.

## 2019-08-24 ENCOUNTER — Other Ambulatory Visit: Payer: Self-pay | Admitting: Internal Medicine

## 2019-08-24 DIAGNOSIS — Z1231 Encounter for screening mammogram for malignant neoplasm of breast: Secondary | ICD-10-CM

## 2019-09-30 ENCOUNTER — Ambulatory Visit
Admission: RE | Admit: 2019-09-30 | Discharge: 2019-09-30 | Disposition: A | Discharge status: Home / Self Care | Payer: Medicare HMO | Source: Ambulatory Visit | Attending: Internal Medicine | Admitting: Internal Medicine

## 2019-09-30 ENCOUNTER — Other Ambulatory Visit: Payer: Self-pay

## 2019-09-30 DIAGNOSIS — Z1231 Encounter for screening mammogram for malignant neoplasm of breast: Secondary | ICD-10-CM | POA: Diagnosis present

## 2020-04-14 ENCOUNTER — Other Ambulatory Visit (HOSPITAL_COMMUNITY): Payer: Self-pay | Admitting: Neurology

## 2020-04-14 ENCOUNTER — Other Ambulatory Visit: Payer: Self-pay | Admitting: Neurology

## 2020-04-14 DIAGNOSIS — R4189 Other symptoms and signs involving cognitive functions and awareness: Secondary | ICD-10-CM

## 2020-04-26 ENCOUNTER — Other Ambulatory Visit: Payer: Self-pay | Admitting: Neurology

## 2020-04-26 DIAGNOSIS — G3184 Mild cognitive impairment, so stated: Secondary | ICD-10-CM

## 2020-04-27 ENCOUNTER — Ambulatory Visit: Payer: Medicare HMO

## 2020-05-08 ENCOUNTER — Ambulatory Visit
Admission: RE | Admit: 2020-05-08 | Discharge: 2020-05-08 | Disposition: A | Discharge status: Home / Self Care | Payer: Medicare HMO | Source: Ambulatory Visit | Attending: Neurology | Admitting: Neurology

## 2020-05-08 ENCOUNTER — Other Ambulatory Visit: Payer: Self-pay

## 2020-05-08 DIAGNOSIS — G3184 Mild cognitive impairment, so stated: Secondary | ICD-10-CM | POA: Diagnosis present

## 2020-05-08 MED ORDER — GADOBUTROL 1 MMOL/ML IV SOLN
9.0000 mL | Freq: Once | INTRAVENOUS | Status: AC | PRN
  Administered 2020-05-08: 9 mL via INTRAVENOUS

## 2020-06-30 ENCOUNTER — Ambulatory Visit (INDEPENDENT_AMBULATORY_CARE_PROVIDER_SITE_OTHER): Payer: Medicare HMO | Admitting: Vascular Surgery

## 2020-06-30 ENCOUNTER — Other Ambulatory Visit: Payer: Self-pay

## 2020-06-30 ENCOUNTER — Ambulatory Visit (INDEPENDENT_AMBULATORY_CARE_PROVIDER_SITE_OTHER): Payer: Medicare HMO

## 2020-06-30 ENCOUNTER — Encounter (INDEPENDENT_AMBULATORY_CARE_PROVIDER_SITE_OTHER): Payer: Self-pay | Admitting: Vascular Surgery

## 2020-06-30 VITALS — BP 122/62 | HR 68 | Resp 16 | Wt 201.8 lb

## 2020-06-30 DIAGNOSIS — I739 Peripheral vascular disease, unspecified: Secondary | ICD-10-CM

## 2020-06-30 DIAGNOSIS — E782 Mixed hyperlipidemia: Secondary | ICD-10-CM | POA: Diagnosis not present

## 2020-06-30 DIAGNOSIS — I6523 Occlusion and stenosis of bilateral carotid arteries: Secondary | ICD-10-CM | POA: Diagnosis not present

## 2020-06-30 DIAGNOSIS — I10 Essential (primary) hypertension: Secondary | ICD-10-CM | POA: Diagnosis not present

## 2020-06-30 DIAGNOSIS — K559 Vascular disorder of intestine, unspecified: Secondary | ICD-10-CM | POA: Insufficient documentation

## 2020-06-30 DIAGNOSIS — M1990 Unspecified osteoarthritis, unspecified site: Secondary | ICD-10-CM | POA: Insufficient documentation

## 2020-06-30 DIAGNOSIS — M810 Age-related osteoporosis without current pathological fracture: Secondary | ICD-10-CM | POA: Insufficient documentation

## 2020-06-30 NOTE — Progress Notes (Signed)
MRN : [id number]  Gabrielle Morris is a 80 y.o. (15-Dec-1940) female who presents with chief complaint of No chief complaint on file. Marland Kitchen  History of Present Illness:   The patient is seen for follow up evaluation of carotid stenosis. The carotid stenosis followed by ultrasound.   The patient denies amaurosis fugax. There is no recent history of TIA symptoms or focal motor deficits. There is no prior documented CVA.  The patient is taking enteric-coated aspirin 81 mg daily.  There is no history of migraine headaches. There is no history of seizures.  The patient has a history of coronary artery disease, no recent episodes of angina or shortness of breath. The patient denies PAD or claudication symptoms. There is a history of hyperlipidemia which is being treated with a statin.  Duplex ultrasound of the carotid arteries demonstrates a stable 40 to 59% stenosis of the right internal carotid artery as well as an increase in the left ICA to 60-79% stenosis. No change compared to the study 08/03/2017.  No outpatient medications have been marked as taking for the 06/30/20 encounter (Appointment) with Delana Meyer, Dolores Lory, MD.    Past Medical History:  Diagnosis Date  . Arthritis    JOINT STIFFNESS  . Bilateral carpal tunnel syndrome   . Dysrhythmia    PVC'S  LAST YEAR/ NONE RECENTLY/DR KOWALSKY   . Fibrocystic breast disease   . GERD (gastroesophageal reflux disease)   . Hyperlipidemia   . Hypertension    CONTROLLED ON MEDS  . Neutropenia   . Onychomycosis   . Osteoporosis   . Premature ventricular contractions    DR Mabeline Caras CONSULTED, EKG IN JAN NEXT ONE IN [place]  . Reflux   . Right carotid artery occlusion    /SLIGHT NARROWING  . Thrombocytopenia ([place])    mild    Past Surgical History:  Procedure Laterality Date  . BREAST EXCISIONAL BIOPSY Left 1991,1992   neg  . CARDIAC CATHETERIZATION  2005   negatvie CAD, right partial carotid artery blockage  . CATARACT  EXTRACTION Right 2010  . CATARACT EXTRACTION W/PHACO Left 09/01/2014   Procedure: CATARACT EXTRACTION PHACO AND INTRAOCULAR LENS PLACEMENT (IOC);  Surgeon: Leandrew Koyanagi, MD;  Location: [place];  Service: Ophthalmology;  Laterality: Left;  . COLONOSCOPY WITH PROPOFOL N/A 03/17/2015   Procedure: COLONOSCOPY WITH PROPOFOL;  Surgeon: Manya Silvas, MD;  Location: [facility] END[facility]COPY;  Service: Endoscopy;  Laterality: N/A;  . KYPHOPLASTY N/A 11/08/2017   Procedure: [id number];  Surgeon: Hessie Knows, MD;  Location: [facility] ORS;  Service: Orthopedics;  Laterality: N/A;  . PATELLA FRACTURE SURGERY Right 2013   HARDWARE REMOVED 2015 SEPERATE SURGERY    Social History Social History   Tobacco Use  . Smoking status: Never Smoker  . Smokeless tobacco: Never Used  Vaping Use  . Vaping Use: Never used  Substance Use Topics  . Alcohol use: No  . Drug use: No    Family History Family History  Problem Relation Age of Onset  . Heart disease Other   . Hypertension Other   . Hypertension Maternal Aunt   . Breast cancer Maternal Aunt   . Hypertension Paternal Aunt   . Breast cancer Paternal Aunt     No Known Allergies   REVIEW OF SYSTEMS (Negative unless checked)  Constitutional: [] Weight loss  [] Fever  [] Chills Cardiac: [] Chest pain   [] Chest pressure   [] Palpitations   [] Shortness of breath when laying flat   [] Shortness of breath with exertion.  Vascular:  [] Pain in legs with walking   [] Pain in legs at rest  [] History of DVT   [] Phlebitis   [] Swelling in legs   [] Varicose veins   [] Non-healing ulcers Pulmonary:   [] Uses home oxygen   [] Productive cough   [] Hemoptysis   [] Wheeze  [] COPD   [] Asthma Neurologic:  [] Dizziness   [] Seizures   [] History of stroke   [] History of TIA  [] Aphasia   [] Vissual changes   [] Weakness or numbness in arm   [] Weakness or numbness in leg Musculoskeletal:   [] Joint swelling   [] Joint pain   [] Low back pain Hematologic:  [] Easy bruising  [] Easy  bleeding   [] Hypercoagulable state   [] Anemic Gastrointestinal:  [] Diarrhea   [] Vomiting  [] Gastroesophageal reflux/heartburn   [] Difficulty swallowing. Genitourinary:  [] Chronic kidney disease   [] Difficult urination  [] Frequent urination   [] Blood in urine Skin:  [] Rashes   [] Ulcers  Psychological:  [] History of anxiety   []  History of major depression.  Physical Examination  There were no vitals filed for this visit. There is no height or weight on file to calculate BMI. Gen: WD/WN, NAD Head: [place]/AT, No temporalis wasting.  Ear/Nose/Throat: Hearing grossly intact, nares w/o erythema or drainage Eyes: PER, EOMI, sclera nonicteric.  Neck: Supple, no large masses.   Pulmonary:  Good air movement, no audible wheezing bilaterally, no use of accessory muscles.  Cardiac: RRR, no JVD Vascular: bilateral carotid bruits Vessel Right Left  Radial Palpable Palpable  Brachial Palpable Palpable  Carotid Palpable Palpable  Gastrointestinal: Non-distended. No guarding/no peritoneal signs.  Musculoskeletal: M/S 5/5 throughout.  No deformity or atrophy.  Neurologic: CN 2-12 intact. Symmetrical.  Speech is fluent. Motor exam as listed above. Psychiatric: Judgment intact, Mood & affect appropriate for pt's clinical situation. Dermatologic: No rashes or ulcers noted.  No changes consistent with cellulitis.   CBC Lab Results  Component Value Date   WBC 9.5 11/08/2017   HGB 13.2 11/08/2017   HCT 37.4 11/08/2017   MCV 88.8 11/08/2017   PLT 222 11/08/2017    BMET    Component Value Date/Time   NA 134 (L) 11/08/2017 1031   K 3.2 (L) 11/08/2017 1031   K 3.9 06/01/2013 1345   CL 98 11/08/2017 1031   CO2 29 11/08/2017 1031   GLUCOSE 101 (H) 11/08/2017 1031   BUN 15 11/08/2017 1031   CREATININE 0.72 11/08/2017 1031   CALCIUM 9.3 11/08/2017 1031   GFRNONAA >60 11/08/2017 1031   GFRAA >60 11/08/2017 1031   CrCl cannot be calculated (Patient's most recent lab result is older than the maximum  21 days allowed.).  COAG No results found for: INR, PROTIME  Radiology No results found.   Assessment/Plan 1. Bilateral carotid artery stenosis Recommend:  Given the patient's asymptomatic subcritical stenosis no further invasive testing or surgery at this time.  Duplex ultrasound shows RICA 41-66% and LICA 06-30%.  Continue antiplatelet therapy as prescribed Continue management of CAD, HTN and Hyperlipidemia Healthy heart diet,  encouraged exercise at least 4 times per week Follow up in 12 months with duplex ultrasound and physical exam   - VAS US CAROTID; Future  2. PAD (peripheral artery disease) ([facility]) Recommend:  I do not find evidence of life style limiting vascular disease. The patient specifically denies life style limitation.  Previous noninvasive studies including ABI's of the legs do not identify critical vascular problems.  The patient should continue walking and begin a more formal exercise program. The patient should continue his antiplatelet  therapy and aggressive treatment of the lipid abnormalities.  The patient should begin wearing graduated compression socks 15-20 mmHg strength to control her mild edema.  3. Mixed hyperlipidemia Continue statin as ordered and reviewed, no changes at this time   4. Primary hypertension Continue antihypertensive medications as already ordered, these medications have been reviewed and there are no changes at this time.    Hortencia Pilar, MD  06/30/2020 12:30 PM

## 2020-07-01 ENCOUNTER — Encounter (INDEPENDENT_AMBULATORY_CARE_PROVIDER_SITE_OTHER): Payer: Self-pay | Admitting: Vascular Surgery

## 2020-08-12 ENCOUNTER — Encounter: Payer: Self-pay | Admitting: *Deleted

## 2020-08-15 ENCOUNTER — Encounter: Payer: Self-pay | Admitting: *Deleted

## 2020-08-15 ENCOUNTER — Ambulatory Visit
Admission: RE | Admit: 2020-08-15 | Discharge: 2020-08-15 | Disposition: A | Discharge status: Home / Self Care | Payer: Medicare HMO | Attending: Gastroenterology | Admitting: Gastroenterology

## 2020-08-15 ENCOUNTER — Encounter
Admission: RE | Disposition: A | Discharge status: Home / Self Care | Payer: Self-pay | Source: Home / Self Care | Attending: Gastroenterology

## 2020-08-15 ENCOUNTER — Ambulatory Visit: Payer: Medicare HMO | Admitting: Anesthesiology

## 2020-08-15 DIAGNOSIS — E785 Hyperlipidemia, unspecified: Secondary | ICD-10-CM | POA: Insufficient documentation

## 2020-08-15 DIAGNOSIS — Z7982 Long term (current) use of aspirin: Secondary | ICD-10-CM | POA: Diagnosis not present

## 2020-08-15 DIAGNOSIS — Z1211 Encounter for screening for malignant neoplasm of colon: Secondary | ICD-10-CM | POA: Insufficient documentation

## 2020-08-15 DIAGNOSIS — I10 Essential (primary) hypertension: Secondary | ICD-10-CM | POA: Diagnosis not present

## 2020-08-15 DIAGNOSIS — K640 First degree hemorrhoids: Secondary | ICD-10-CM | POA: Insufficient documentation

## 2020-08-15 DIAGNOSIS — Z7983 Long term (current) use of bisphosphonates: Secondary | ICD-10-CM | POA: Insufficient documentation

## 2020-08-15 DIAGNOSIS — D123 Benign neoplasm of transverse colon: Secondary | ICD-10-CM | POA: Diagnosis not present

## 2020-08-15 DIAGNOSIS — Z79899 Other long term (current) drug therapy: Secondary | ICD-10-CM | POA: Diagnosis not present

## 2020-08-15 DIAGNOSIS — K219 Gastro-esophageal reflux disease without esophagitis: Secondary | ICD-10-CM | POA: Diagnosis not present

## 2020-08-15 DIAGNOSIS — Z8371 Family history of colonic polyps: Secondary | ICD-10-CM | POA: Diagnosis not present

## 2020-08-15 HISTORY — DX: Sleep apnea, unspecified: G47.30

## 2020-08-15 HISTORY — PX: COLONOSCOPY WITH PROPOFOL: SHX5780

## 2020-08-15 SURGERY — COLONOSCOPY WITH PROPOFOL
Anesthesia: General

## 2020-08-15 MED ORDER — PROPOFOL 500 MG/50ML IV EMUL
INTRAVENOUS | Status: DC | PRN
  Administered 2020-08-15: 120 ug/kg/min via INTRAVENOUS

## 2020-08-15 MED ORDER — SODIUM CHLORIDE 0.9 % IV SOLN: INTRAVENOUS | Status: DC

## 2020-08-15 MED ORDER — PROPOFOL 500 MG/50ML IV EMUL
INTRAVENOUS | Status: AC
  Filled 2020-08-15: qty 50

## 2020-08-15 NOTE — Anesthesia Postprocedure Evaluation (Signed)
Anesthesia Post Note  Patient: Gabrielle Morris  Procedure(s) Performed: COLONOSCOPY WITH PROPOFOL  Patient location during evaluation: Phase II Anesthesia Type: General Level of consciousness: awake and alert, awake and oriented Pain management: pain level controlled Vital Signs Assessment: post-procedure vital signs reviewed and stable Respiratory status: spontaneous breathing, nonlabored ventilation and respiratory function stable Cardiovascular status: blood pressure returned to baseline and stable Postop Assessment: no apparent nausea or vomiting Anesthetic complications: no   No notable events documented.   Last Vitals:  Vitals:   08/15/20 0900 08/15/20 0910  BP: 136/82 (!) 142/72  Pulse: (!) 59 (!) 53  Resp: 17 20  Temp:    SpO2: 100% 100%    Last Pain:  Vitals:   08/15/20 0840  TempSrc: Temporal  PainSc:                  Phill Mutter

## 2020-08-15 NOTE — Op Note (Signed)
[facility] Patient Name: Gabrielle Morris Procedure Date: 08/15/2020 8:08 AM MRN: [id number] Account #: [id number] Date of Birth: 12/30/1940 Admit Type: Outpatient Age: 80 Room: [facility] ENDO ROOM 3 Gender: Female Note Status: Finalized Procedure:             Colonoscopy Indications:           Colon cancer screening in patient at increased risk:                         Family history of colon polyps in multiple 1st-degree                         relatives Providers:             Andrey Farmer MD, MD Referring MD:          Ramonita Lab, MD (Referring MD) Medicines:             Monitored Anesthesia Care Complications:         No immediate complications. Estimated blood loss:                         Minimal. Procedure:             Pre-Anesthesia Assessment:                        - Prior to the procedure, a History and Physical was                         performed, and patient medications and allergies were                         reviewed. The patient is competent. The risks and                         benefits of the procedure and the sedation options and                         risks were discussed with the patient. All questions                         were answered and informed consent was obtained.                         Patient identification and proposed procedure were                         verified by the physician, the nurse, the anesthetist                         and the technician in the endoscopy suite. Mental                         Status Examination: alert and oriented. Airway                         Examination: normal oropharyngeal airway and neck  mobility. Respiratory Examination: clear to                         auscultation. CV Examination: normal. Prophylactic                         Antibiotics: The patient does not require prophylactic                         antibiotics. Prior Anticoagulants: The  patient has                         taken no previous anticoagulant or antiplatelet                         agents. ASA Grade Assessment: II - A patient with mild                         systemic disease. After reviewing the risks and                         benefits, the patient was deemed in satisfactory                         condition to undergo the procedure. The anesthesia                         plan was to use monitored anesthesia care (MAC).                         Immediately prior to administration of medications,                         the patient was re-assessed for adequacy to receive                         sedatives. The heart rate, respiratory rate, oxygen                         saturations, blood pressure, adequacy of pulmonary                         ventilation, and response to care were monitored                         throughout the procedure. The physical status of the                         patient was re-assessed after the procedure.                        After obtaining informed consent, the colonoscope was                         passed under direct vision. Throughout the procedure,                         the patient's blood pressure, pulse, and oxygen  saturations were monitored continuously. The                         Colonoscope was introduced through the anus and                         advanced to the the cecum, identified by appendiceal                         orifice and ileocecal valve. The colonoscopy was                         performed without difficulty. The patient tolerated                         the procedure well. The quality of the bowel                         preparation was excellent. Findings:      The perianal and digital rectal examinations were normal.      A 1 mm polyp was found in the splenic flexure. The polyp was sessile.       The polyp was removed with a jumbo cold forceps. Resection and retrieval        were complete. Estimated blood loss was minimal.      Internal hemorrhoids were found during retroflexion. The hemorrhoids       were Grade I (internal hemorrhoids that do not prolapse).      The exam was otherwise without abnormality on direct and retroflexion       views. Impression:            - One 1 mm polyp at the splenic flexure, removed with                         a jumbo cold forceps. Resected and retrieved.                        - Internal hemorrhoids.                        - The examination was otherwise normal on direct and                         retroflexion views. Recommendation:        - Discharge patient to home.                        - Resume previous diet.                        - Continue present medications.                        - Await pathology results.                        - Repeat colonoscopy is not recommended due to current                         age (59 years or older) for surveillance.                        -  Return to referring physician as previously                         scheduled. Procedure Code(s):     --- Professional ---                        [phone], Colonoscopy, flexible; with biopsy, single or                         multiple Diagnosis Code(s):     --- Professional ---                        Z83.71, Family history of colonic polyps                        K63.5, Polyp of colon                        K64.0, First degree hemorrhoids CPT copyright 2019 [employer]. All rights reserved. The codes documented in this report are preliminary and upon coder review may  be revised to meet current compliance requirements. Andrey Farmer MD, MD 08/15/2020 8:40:03 AM Number of Addenda: 0 Note Initiated On: 08/15/2020 8:08 AM Scope Withdrawal Time: 0 hours 8 minutes 41 seconds  Total Procedure Duration: 0 hours 15 minutes 19 seconds  Estimated Blood Loss:  Estimated blood loss was minimal.      [facility]

## 2020-08-15 NOTE — Anesthesia Procedure Notes (Signed)
Date/Time: 08/15/2020 8:23 AM Performed by: Vaughan Sine Pre-anesthesia Checklist: Patient identified, Emergency Drugs available, Suction available and Patient being monitored Patient Re-evaluated:Patient Re-evaluated prior to induction Oxygen Delivery Method: Nasal cannula Preoxygenation: Pre-oxygenation with 100% oxygen Induction Type: IV induction Placement Confirmation: CO2 detector and positive ETCO2

## 2020-08-15 NOTE — H&P (Signed)
Outpatient short stay form Pre-procedure 08/15/2020 8:11 AM Gabrielle Miyamoto MD, MPH  Primary Physician: Dr. Caryl Comes  Reason for visit:  Family history of polyps  History of present illness:   80 y/o lady with history of hypertension, hyperlipidemia, and GERD here for colonoscopy due to family history of polyps. No family history of GI malignancies. No blood thinners. No abdominal surgeries.    Current Facility-Administered Medications:    0.9 %  sodium chloride infusion, , Intravenous, Continuous, Abhimanyu Cruces, Hilton Cork, MD, Last Rate: 20 mL/hr at 08/15/20 0809, Continued from Pre-op at 08/15/20 0809  Medications Prior to Admission  Medication Sig Dispense Refill Last Dose   aspirin 81 MG EC tablet Take 81 mg by mouth daily. am   08/14/2020   atorvastatin (LIPITOR) 40 MG tablet Take 40 mg by mouth at bedtime.   08/14/2020   Calcium Citrate-Vitamin D 315-250 MG-UNIT TABS Take 1 tablet by mouth daily.    08/14/2020   Cholecalciferol (VITAMIN D3) 2000 UNITS TABS Take 2,000 Units by mouth every morning. AM   08/14/2020   cyanocobalamin (,VITAMIN B-12,) 1000 MCG/ML injection Inject into the muscle every 30 (thirty) days.   08/14/2020   donepezil (ARICEPT) 5 MG tablet Take 5 mg by mouth daily.   08/14/2020   hydrochlorothiazide (HYDRODIURIL) 12.5 MG tablet Take 12.5 mg by mouth daily.    08/14/2020   losartan (COZAAR) 100 MG tablet Take 100 mg by mouth daily.    08/15/2020   omeprazole (PRILOSEC) 20 MG capsule Take 20 mg by mouth daily.   08/15/2020   acetaminophen (TYLENOL) 500 MG tablet Take 500 mg by mouth every 6 (six) hours as needed for mild pain.       alendronate (FOSAMAX) 70 MG tablet Take by mouth.      fluticasone (FLONASE) 50 MCG/ACT nasal spray Place 2 sprays into the nose as needed for allergies. Pm        No Known Allergies   Past Medical History:  Diagnosis Date   Arthritis    JOINT STIFFNESS   Bilateral carpal tunnel syndrome    Dysrhythmia    PVC'S  LAST YEAR/ NONE RECENTLY/DR  KOWALSKY    Fibrocystic breast disease    GERD (gastroesophageal reflux disease)    Hyperlipidemia    Hypertension    CONTROLLED ON MEDS   Neutropenia    Onychomycosis    Osteoporosis    Premature ventricular contractions    DR KOWALSKY CONSULTED, EKG IN JAN NEXT ONE IN JULY   Reflux    Right carotid artery occlusion    /SLIGHT NARROWING   Sleep apnea    Thrombocytopenia (HCC)    mild    Review of systems:  Otherwise negative.    Physical Exam  Gen: Alert, oriented. Appears stated age.  HEENT: PERRLA. Lungs: No respiratory distress CV: RRR Abd: soft, benign, no masses Ext: No edema    Planned procedures: Proceed with colonoscopy. The patient understands the nature of the planned procedure, indications, risks, alternatives and potential complications including but not limited to bleeding, infection, perforation, damage to internal organs and possible oversedation/side effects from anesthesia. The patient agrees and gives consent to proceed.  Please refer to procedure notes for findings, recommendations and patient disposition/instructions.     Gabrielle Miyamoto MD, MPH Gastroenterology 08/15/2020  8:11 AM

## 2020-08-15 NOTE — Transfer of Care (Signed)
Immediate Anesthesia Transfer of Care Note  Patient: Gabrielle Morris  Procedure(s) Performed: COLONOSCOPY WITH PROPOFOL  Patient Location: PACU   Anesthesia Type:General  Level of Consciousness: awake and sedated  Airway & Oxygen Therapy: Patient Spontanous Breathing and Patient connected to nasal cannula oxygen  Post-op Assessment: Report given to RN and Post -op Vital signs reviewed and stable  Post vital signs: Reviewed and stable  Last Vitals:  Vitals Value Taken Time  BP    Temp    Pulse    Resp    SpO2      Last Pain:  Vitals:   08/15/20 0752  TempSrc: Temporal  PainSc: 0-No pain         Complications: No notable events documented.

## 2020-08-15 NOTE — Anesthesia Preprocedure Evaluation (Signed)
Anesthesia Evaluation  Patient identified by MRN, date of birth, ID band Patient awake    Reviewed: Allergy & Precautions, H&P , NPO status , Patient's Chart, lab work & pertinent test results  History of Anesthesia Complications Negative for: history of anesthetic complications  Airway Mallampati: III  TM Distance: <3 FB Neck ROM: limited    Dental  (+) Chipped, Poor Dentition   Pulmonary neg shortness of breath, sleep apnea ,    Pulmonary exam normal        Cardiovascular Exercise Tolerance: Good hypertension, Pt. on medications (-) angina+ Peripheral Vascular Disease  (-) Past MI and (-) DOE Normal cardiovascular exam+ dysrhythmias      Neuro/Psych  Neuromuscular disease negative psych ROS   GI/Hepatic Neg liver ROS, Bowel prep,GERD  Medicated and Controlled,  Endo/Other  negative endocrine ROS  Renal/GU negative Renal ROS  negative genitourinary   Musculoskeletal  (+) Arthritis ,   Abdominal   Peds  Hematology negative hematology ROS (+)   Anesthesia Other Findings Past Medical History: No date: Arthritis     Comment:  JOINT STIFFNESS No date: Bilateral carpal tunnel syndrome No date: Dysrhythmia     Comment:  PVC'S  LAST YEAR/ NONE RECENTLY/DR KOWALSKY  No date: Fibrocystic breast disease No date: GERD (gastroesophageal reflux disease) No date: Hyperlipidemia No date: Hypertension     Comment:  CONTROLLED ON MEDS No date: Neutropenia No date: Onychomycosis No date: Osteoporosis No date: Premature ventricular contractions     Comment:  DR Mabeline Caras CONSULTED, EKG IN JAN NEXT ONE IN JULY No date: Reflux No date: Right carotid artery occlusion     Comment:  /SLIGHT NARROWING No date: Thrombocytopenia ([place])     Comment:  mild  Past Surgical History: 3295,[id number]: BREAST EXCISIONAL BIOPSY; Left     Comment:  neg 2005: CARDIAC CATHETERIZATION     Comment:  negatvie CAD, right partial carotid artery  blockage 2010: CATARACT EXTRACTION; Right 09/01/2014: CATARACT EXTRACTION W/PHACO; Left     Comment:  Procedure: CATARACT EXTRACTION PHACO AND INTRAOCULAR               LENS PLACEMENT (IOC);  Surgeon: Leandrew Koyanagi, MD;               Location: [place];  Service: Ophthalmology;                Laterality: Left; 03/17/2015: COLONOSCOPY WITH PROPOFOL; N/A     Comment:  Procedure: COLONOSCOPY WITH PROPOFOL;  Surgeon: Manya Silvas, MD;  Location: [facility] ENDOSCOPY;  Service:               Endoscopy;  Laterality: N/A; 2013: PATELLA FRACTURE SURGERY; Right     Comment:  HARDWARE REMOVED 2015 SEPERATE SURGERY  BMI    Body Mass Index:  33.79 kg/m      Reproductive/Obstetrics negative OB ROS                             Anesthesia Physical  Anesthesia Plan  ASA: 3  Anesthesia Plan: General   Post-op Pain Management:    Induction: Intravenous  PONV Risk Score and Plan: Propofol infusion and TIVA  Airway Management Planned: Natural Airway and Nasal Cannula  Additional Equipment:   Intra-op Plan:   Post-operative Plan:   Informed Consent: I have reviewed the patients History and Physical, chart, labs and discussed  the procedure including the risks, benefits and alternatives for the proposed anesthesia with the patient or authorized representative who has indicated his/her understanding and acceptance.     Dental Advisory Given  Plan Discussed with: Anesthesiologist, CRNA and Surgeon  Anesthesia Plan Comments: (Patient consented for risks of anesthesia including but not limited to:  - adverse reactions to medications - risk of intubation if required - damage to teeth, lips or other oral mucosa - sore throat or hoarseness - Damage to heart, brain, lungs or loss of life  Patient voiced understanding.)        Anesthesia Quick Evaluation

## 2020-08-15 NOTE — Interval H&P Note (Signed)
History and Physical Interval Note:  08/15/2020 8:13 AM  Gabrielle Morris  has presented today for surgery, with the diagnosis of history of polyps.  The various methods of treatment have been discussed with the patient and family. After consideration of risks, benefits and other options for treatment, the patient has consented to  Procedure(s): COLONOSCOPY WITH PROPOFOL (N/A) as a surgical intervention.  The patient's history has been reviewed, patient examined, no change in status, stable for surgery.  I have reviewed the patient's chart and labs.  Questions were answered to the patient's satisfaction.     Lesly Rubenstein  Ok to proceed with colonoscopy

## 2020-08-16 ENCOUNTER — Encounter: Payer: Self-pay | Admitting: Gastroenterology

## 2020-08-16 LAB — SURGICAL PATHOLOGY

## 2020-08-23 ENCOUNTER — Other Ambulatory Visit: Payer: Self-pay | Admitting: Internal Medicine

## 2020-08-23 DIAGNOSIS — Z1231 Encounter for screening mammogram for malignant neoplasm of breast: Secondary | ICD-10-CM

## 2020-09-30 ENCOUNTER — Other Ambulatory Visit: Payer: Self-pay

## 2020-09-30 ENCOUNTER — Ambulatory Visit
Admission: RE | Admit: 2020-09-30 | Discharge: 2020-09-30 | Disposition: A | Discharge status: Home / Self Care | Payer: Medicare HMO | Source: Ambulatory Visit | Attending: Internal Medicine | Admitting: Internal Medicine

## 2020-09-30 DIAGNOSIS — Z1231 Encounter for screening mammogram for malignant neoplasm of breast: Secondary | ICD-10-CM | POA: Diagnosis not present

## 2021-06-30 ENCOUNTER — Other Ambulatory Visit (INDEPENDENT_AMBULATORY_CARE_PROVIDER_SITE_OTHER): Payer: Self-pay | Admitting: Vascular Surgery

## 2021-06-30 DIAGNOSIS — I6523 Occlusion and stenosis of bilateral carotid arteries: Secondary | ICD-10-CM

## 2021-07-02 NOTE — Progress Notes (Signed)
? ? ? ? ?MRN : [id number] ? ?Gabrielle Morris is a 81 y.o. (1940-03-06) female who presents with chief complaint of check carotid arteries. ? ?History of Present Illness:  ? ?The patient is seen for follow up evaluation of carotid stenosis. The carotid stenosis followed by ultrasound.  ?  ?The patient denies amaurosis fugax. There is no recent history of TIA symptoms or focal motor deficits. There is no prior documented CVA. ?  ?The patient is taking enteric-coated aspirin 81 mg daily. ?  ?There is no history of migraine headaches. There is no history of seizures. ?  ?The patient has a history of coronary artery disease, no recent episodes of angina or shortness of breath. ?The patient denies PAD or claudication symptoms. ?There is a history of hyperlipidemia which is being treated with a statin.   ?  ?Duplex ultrasound of the carotid arteries demonstrates a stable 1-39% stenosis of the right internal carotid artery and 49-67% LICA stenosis.  No significant change compared to the previous study. ? ?No outpatient medications have been marked as taking for the 07/03/21 encounter (Appointment) with Delana Meyer, Dolores Lory, MD.  ? ? ?Past Medical History:  ?Diagnosis Date  ? Arthritis   ? JOINT STIFFNESS  ? Bilateral carpal tunnel syndrome   ? Dysrhythmia   ? PVC'S  LAST YEAR/ NONE RECENTLY/DR KOWALSKY   ? Fibrocystic breast disease   ? GERD (gastroesophageal reflux disease)   ? Hyperlipidemia   ? Hypertension   ? CONTROLLED ON MEDS  ? Neutropenia   ? Onychomycosis   ? Osteoporosis   ? Premature ventricular contractions   ? DR Mabeline Caras CONSULTED, EKG IN JAN NEXT ONE IN JULY  ? Reflux   ? Right carotid artery occlusion   ? /SLIGHT NARROWING  ? Sleep apnea   ? Thrombocytopenia ([place])   ? mild  ? ? ?Past Surgical History:  ?Procedure Laterality Date  ? BREAST EXCISIONAL BIOPSY Left [id number]  ? neg  ? CARDIAC CATHETERIZATION  2005  ? negatvie CAD, right partial carotid artery blockage  ? CATARACT EXTRACTION Right 2010  ?  CATARACT EXTRACTION W/PHACO Left 09/01/2014  ? Procedure: CATARACT EXTRACTION PHACO AND INTRAOCULAR LENS PLACEMENT (IOC);  Surgeon: Leandrew Koyanagi, MD;  Location: [place];  Service: Ophthalmology;  Laterality: Left;  ? COLONOSCOPY WITH PROPOFOL N/A 03/17/2015  ? Procedure: COLONOSCOPY WITH PROPOFOL;  Surgeon: Manya Silvas, MD;  Location: [facility] ENDOSCOPY;  Service: Endoscopy;  Laterality: N/A;  ? COLONOSCOPY WITH PROPOFOL N/A 08/15/2020  ? Procedure: COLONOSCOPY WITH PROPOFOL;  Surgeon: Lesly Rubenstein, MD;  Location: [facility] ENDOSCOPY;  Service: Endoscopy;  Laterality: N/A;  ? KYPHOPLASTY N/A 11/08/2017  ? Procedure: [id number];  Surgeon: Hessie Knows, MD;  Location: [facility];  Service: Orthopedics;  Laterality: N/A;  ? PATELLA FRACTURE SURGERY Right 2013  ? HARDWARE REMOVED 2015 SEPERATE SURGERY  ? ? ?Social History ?Social History  ? ?Tobacco Use  ? Smoking status: Never  ? Smokeless tobacco: Never  ?Vaping Use  ? Vaping Use: Never used  ?Substance Use Topics  ? Alcohol use: No  ? Drug use: No  ? ? ?Family History ?Family History  ?Problem Relation Age of Onset  ? Heart disease Other   ? Hypertension Other   ? Hypertension Maternal Aunt   ? Breast cancer Maternal Aunt   ? Hypertension Paternal Aunt   ? Breast cancer Paternal Aunt   ? ? ?No Known Allergies ? ? ?REVIEW OF SYSTEMS (Negative unless checked) ? ?Constitutional: '[]'$ Weight  loss  '[]'$ Fever  '[]'$ Chills ?Cardiac: '[]'$ Chest pain   '[]'$ Chest pressure   '[]'$ Palpitations   '[]'$ Shortness of breath when laying flat   '[]'$ Shortness of breath with exertion. ?Vascular:  '[x]'$ Pain in legs with walking   '[]'$ Pain in legs at rest  '[]'$ History of DVT   '[]'$ Phlebitis   '[]'$ Swelling in legs   '[]'$ Varicose veins   '[]'$ Non-healing ulcers ?Pulmonary:   '[]'$ Uses home oxygen   '[]'$ Productive cough   '[]'$ Hemoptysis   '[]'$ Wheeze  '[]'$ COPD   '[]'$ Asthma ?Neurologic:  '[]'$ Dizziness   '[]'$ Seizures   '[]'$ History of stroke   '[]'$ History of TIA  '[]'$ Aphasia   '[]'$ Vissual changes   '[]'$ Weakness or numbness in arm    '[]'$ Weakness or numbness in leg ?Musculoskeletal:   '[]'$ Joint swelling   '[]'$ Joint pain   '[]'$ Low back pain ?Hematologic:  '[]'$ Easy bruising  '[]'$ Easy bleeding   '[]'$ Hypercoagulable state   '[]'$ Anemic ?Gastrointestinal:  '[]'$ Diarrhea   '[]'$ Vomiting  '[]'$ Gastroesophageal reflux/heartburn   '[]'$ Difficulty swallowing. ?Genitourinary:  '[]'$ Chronic kidney disease   '[]'$ Difficult urination  '[]'$ Frequent urination   '[]'$ Blood in urine ?Skin:  '[]'$ Rashes   '[]'$ Ulcers  ?Psychological:  '[]'$ History of anxiety   '[]'$  History of major depression. ? ?Physical Examination ? ?There were no vitals filed for this visit. ?There is no height or weight on file to calculate BMI. ?Gen: WD/WN, NAD ?Head: [place]/AT, No temporalis wasting.  ?Ear/Nose/Throat: Hearing grossly intact, nares w/o erythema or drainage ?Eyes: PER, EOMI, sclera nonicteric.  ?Neck: Supple, no masses.  No bruit or JVD.  ?Pulmonary:  Good air movement, no audible wheezing, no use of accessory muscles.  ?Cardiac: RRR, normal S1, S2, no Murmurs. ?Vascular:  carotid bruit noted ?Vessel Right Left  ?Radial Palpable Palpable  ?Carotid  Palpable  Palpable  ?Subclav  Palpable Palpable  ?Gastrointestinal: soft, non-distended. No guarding/no peritoneal signs.  ?Musculoskeletal: M/S 5/5 throughout.  No visible deformity.  ?Neurologic: CN 2-12 intact. Pain and light touch intact in extremities.  Symmetrical.  Speech is fluent. Motor exam as listed above. ?Psychiatric: Judgment intact, Mood & affect appropriate for pt's clinical situation. ?Dermatologic: No rashes or ulcers noted.  No changes consistent with cellulitis. ? ? ?CBC ?Lab Results  ?Component Value Date  ? WBC 9.5 11/08/2017  ? HGB 13.2 11/08/2017  ? HCT 37.4 11/08/2017  ? MCV 88.8 11/08/2017  ? PLT 222 11/08/2017  ? ? ?BMET ?   ?Component Value Date/Time  ? NA 134 (L) 11/08/2017 1031  ? K 3.2 (L) 11/08/2017 1031  ? K 3.9 06/01/2013 1345  ? CL 98 11/08/2017 1031  ? CO2 29 11/08/2017 1031  ? GLUCOSE 101 (H) 11/08/2017 1031  ? BUN 15 11/08/2017 1031  ? CREATININE  0.72 11/08/2017 1031  ? CALCIUM 9.3 11/08/2017 1031  ? GFRNONAA >60 11/08/2017 1031  ? GFRAA >60 11/08/2017 1031  ? ?CrCl cannot be calculated (Patient's most recent lab result is older than the maximum 21 days allowed.). ? ?COAG ?No results found for: INR, PROTIME ? ?Radiology ?No results found. ? ? ?Assessment/Plan ?1. Bilateral carotid artery stenosis ?Recommend: ? ?Given the patient's asymptomatic subcritical stenosis no further invasive testing or surgery at this time. ? ?Duplex ultrasound of the carotid arteries demonstrates a stable 1-39% stenosis of the right internal carotid artery and 84-13% LICA stenosis.  ? ?Continue antiplatelet therapy as prescribed ?Continue management of CAD, HTN and Hyperlipidemia ?Healthy heart diet,  encouraged exercise at least 4 times per week ?Follow up in 12 months with duplex ultrasound and physical exam   ?- VAS US CAROTID ?-  VAS US CAROTID; Future ? ?2. PAD (peripheral artery disease) ([place]) ?Recommend: ? ?I do not find evidence of life style limiting vascular disease. The patient specifically denies life style limitation. ? ?Previous noninvasive studies including ABI's of the legs do not identify critical vascular problems. ? ?The patient should continue walking and begin a more formal exercise program. ?The patient should continue his antiplatelet therapy and aggressive treatment of the lipid abnormalities. ? ?The patient is instructed to call the office if there is a significant change in the lower extremity symptoms, particularly if a wound develops or there is an abrupt increase in leg pain. ? ?3. Benign essential hypertension ?Continue antihypertensive medications as already ordered, these medications have been reviewed and there are no changes at this time.  ? ?4. Arthritis ?Continue NSAID medications as already ordered, these medications have been reviewed and there are no changes at this time. ? ?Continued activity and therapy was stressed.  ? ?5. Familial multiple  lipoprotein-type hyperlipidemia ?Continue statin as ordered and reviewed, no changes at this time  ? ? ? ?Hortencia Pilar, MD ? ?07/02/2021 ?4:56 PM ? ?  ?

## 2021-07-03 ENCOUNTER — Encounter (INDEPENDENT_AMBULATORY_CARE_PROVIDER_SITE_OTHER): Payer: Self-pay | Admitting: Vascular Surgery

## 2021-07-03 ENCOUNTER — Ambulatory Visit (INDEPENDENT_AMBULATORY_CARE_PROVIDER_SITE_OTHER): Payer: Medicare HMO | Admitting: Vascular Surgery

## 2021-07-03 ENCOUNTER — Ambulatory Visit (INDEPENDENT_AMBULATORY_CARE_PROVIDER_SITE_OTHER): Payer: Medicare HMO

## 2021-07-03 VITALS — BP 149/74 | HR 72 | Resp 18 | Ht 63.0 in | Wt 207.0 lb

## 2021-07-03 DIAGNOSIS — I739 Peripheral vascular disease, unspecified: Secondary | ICD-10-CM

## 2021-07-03 DIAGNOSIS — M1990 Unspecified osteoarthritis, unspecified site: Secondary | ICD-10-CM

## 2021-07-03 DIAGNOSIS — I6523 Occlusion and stenosis of bilateral carotid arteries: Secondary | ICD-10-CM

## 2021-07-03 DIAGNOSIS — I10 Essential (primary) hypertension: Secondary | ICD-10-CM

## 2021-07-03 DIAGNOSIS — E7849 Other hyperlipidemia: Secondary | ICD-10-CM

## 2021-07-09 ENCOUNTER — Encounter (INDEPENDENT_AMBULATORY_CARE_PROVIDER_SITE_OTHER): Payer: Self-pay | Admitting: Vascular Surgery

## 2021-08-23 ENCOUNTER — Other Ambulatory Visit: Payer: Self-pay | Admitting: Internal Medicine

## 2021-08-23 DIAGNOSIS — Z1231 Encounter for screening mammogram for malignant neoplasm of breast: Secondary | ICD-10-CM

## 2021-10-02 ENCOUNTER — Ambulatory Visit
Admission: RE | Admit: 2021-10-02 | Discharge: 2021-10-02 | Disposition: A | Discharge status: Home / Self Care | Payer: Medicare HMO | Source: Ambulatory Visit | Attending: Internal Medicine | Admitting: Internal Medicine

## 2021-10-02 DIAGNOSIS — Z1231 Encounter for screening mammogram for malignant neoplasm of breast: Secondary | ICD-10-CM | POA: Diagnosis present

## 2022-06-27 ENCOUNTER — Encounter: Payer: Self-pay | Admitting: Emergency Medicine

## 2022-06-27 ENCOUNTER — Inpatient Hospital Stay
Admission: EM | Admit: 2022-06-27 | Discharge: 2022-07-02 | DRG: 866 | Disposition: A | Discharge status: Home Health | Payer: Medicare HMO | Attending: Internal Medicine | Admitting: Internal Medicine

## 2022-06-27 ENCOUNTER — Emergency Department: Payer: Medicare HMO

## 2022-06-27 ENCOUNTER — Other Ambulatory Visit: Payer: Self-pay

## 2022-06-27 DIAGNOSIS — Z7982 Long term (current) use of aspirin: Secondary | ICD-10-CM

## 2022-06-27 DIAGNOSIS — E871 Hypo-osmolality and hyponatremia: Secondary | ICD-10-CM | POA: Diagnosis present

## 2022-06-27 DIAGNOSIS — M810 Age-related osteoporosis without current pathological fracture: Secondary | ICD-10-CM | POA: Diagnosis present

## 2022-06-27 DIAGNOSIS — A770 Spotted fever due to Rickettsia rickettsii: Principal | ICD-10-CM

## 2022-06-27 DIAGNOSIS — Z8249 Family history of ischemic heart disease and other diseases of the circulatory system: Secondary | ICD-10-CM

## 2022-06-27 DIAGNOSIS — Z9841 Cataract extraction status, right eye: Secondary | ICD-10-CM

## 2022-06-27 DIAGNOSIS — R7989 Other specified abnormal findings of blood chemistry: Secondary | ICD-10-CM | POA: Insufficient documentation

## 2022-06-27 DIAGNOSIS — R21 Rash and other nonspecific skin eruption: Secondary | ICD-10-CM | POA: Diagnosis present

## 2022-06-27 DIAGNOSIS — B349 Viral infection, unspecified: Principal | ICD-10-CM | POA: Diagnosis present

## 2022-06-27 DIAGNOSIS — E861 Hypovolemia: Secondary | ICD-10-CM | POA: Diagnosis present

## 2022-06-27 DIAGNOSIS — E785 Hyperlipidemia, unspecified: Secondary | ICD-10-CM | POA: Diagnosis present

## 2022-06-27 DIAGNOSIS — K219 Gastro-esophageal reflux disease without esophagitis: Secondary | ICD-10-CM | POA: Diagnosis present

## 2022-06-27 DIAGNOSIS — R0902 Hypoxemia: Secondary | ICD-10-CM | POA: Diagnosis present

## 2022-06-27 DIAGNOSIS — Z7983 Long term (current) use of bisphosphonates: Secondary | ICD-10-CM

## 2022-06-27 DIAGNOSIS — I10 Essential (primary) hypertension: Secondary | ICD-10-CM | POA: Diagnosis present

## 2022-06-27 DIAGNOSIS — B348 Other viral infections of unspecified site: Secondary | ICD-10-CM | POA: Diagnosis present

## 2022-06-27 DIAGNOSIS — Z1152 Encounter for screening for COVID-19: Secondary | ICD-10-CM

## 2022-06-27 DIAGNOSIS — I2489 Other forms of acute ischemic heart disease: Secondary | ICD-10-CM | POA: Diagnosis present

## 2022-06-27 DIAGNOSIS — Z79899 Other long term (current) drug therapy: Secondary | ICD-10-CM

## 2022-06-27 DIAGNOSIS — E876 Hypokalemia: Secondary | ICD-10-CM | POA: Diagnosis present

## 2022-06-27 DIAGNOSIS — G4730 Sleep apnea, unspecified: Secondary | ICD-10-CM | POA: Diagnosis present

## 2022-06-27 DIAGNOSIS — I739 Peripheral vascular disease, unspecified: Secondary | ICD-10-CM | POA: Diagnosis present

## 2022-06-27 DIAGNOSIS — D696 Thrombocytopenia, unspecified: Secondary | ICD-10-CM | POA: Diagnosis present

## 2022-06-27 DIAGNOSIS — Z9842 Cataract extraction status, left eye: Secondary | ICD-10-CM

## 2022-06-27 DIAGNOSIS — Z961 Presence of intraocular lens: Secondary | ICD-10-CM | POA: Diagnosis present

## 2022-06-27 DIAGNOSIS — W57XXXA Bitten or stung by nonvenomous insect and other nonvenomous arthropods, initial encounter: Secondary | ICD-10-CM | POA: Diagnosis present

## 2022-06-27 DIAGNOSIS — R7401 Elevation of levels of liver transaminase levels: Secondary | ICD-10-CM | POA: Diagnosis present

## 2022-06-27 DIAGNOSIS — R509 Fever, unspecified: Secondary | ICD-10-CM | POA: Diagnosis present

## 2022-06-27 DIAGNOSIS — H532 Diplopia: Secondary | ICD-10-CM | POA: Diagnosis present

## 2022-06-27 DIAGNOSIS — Z803 Family history of malignant neoplasm of breast: Secondary | ICD-10-CM

## 2022-06-27 LAB — CBC WITH DIFFERENTIAL/PLATELET
Abs Immature Granulocytes: 0.03 10*3/uL (ref 0.00–0.07)
Basophils Absolute: 0 10*3/uL (ref 0.0–0.1)
Basophils Relative: 0 %
Eosinophils Absolute: 0 10*3/uL (ref 0.0–0.5)
Eosinophils Relative: 0 %
HCT: 32.8 % — ABNORMAL LOW (ref 36.0–46.0)
Hemoglobin: 11.6 g/dL — ABNORMAL LOW (ref 12.0–15.0)
Immature Granulocytes: 0 %
Lymphocytes Relative: 8 %
Lymphs Abs: 0.7 10*3/uL (ref 0.7–4.0)
MCH: 29.7 pg (ref 26.0–34.0)
MCHC: 35.4 g/dL (ref 30.0–36.0)
MCV: 83.9 fL (ref 80.0–100.0)
Monocytes Absolute: 0.6 10*3/uL (ref 0.1–1.0)
Monocytes Relative: 8 %
Neutro Abs: 6.9 10*3/uL (ref 1.7–7.7)
Neutrophils Relative %: 84 %
Platelets: 94 10*3/uL — ABNORMAL LOW (ref 150–400)
RBC: 3.91 MIL/uL (ref 3.87–5.11)
RDW: 12.2 % (ref 11.5–15.5)
Smear Review: NORMAL
WBC: 8.3 10*3/uL (ref 4.0–10.5)
nRBC: 0 % (ref 0.0–0.2)

## 2022-06-27 LAB — TROPONIN I (HIGH SENSITIVITY)
Troponin I (High Sensitivity): 27 ng/L — ABNORMAL HIGH (ref <18)
Troponin I (High Sensitivity): 28 ng/L — ABNORMAL HIGH (ref <18)

## 2022-06-27 LAB — COMPREHENSIVE METABOLIC PANEL
ALT: 29 U/L (ref 0–44)
AST: 46 U/L — ABNORMAL HIGH (ref 15–41)
Albumin: 3.5 g/dL (ref 3.5–5.0)
Alkaline Phosphatase: 55 U/L (ref 38–126)
Anion gap: 13 (ref 5–15)
BUN: 14 mg/dL (ref 8–23)
CO2: 24 mmol/L (ref 22–32)
Calcium: 8.2 mg/dL — ABNORMAL LOW (ref 8.9–10.3)
Chloride: 84 mmol/L — ABNORMAL LOW (ref 98–111)
Creatinine, Ser: 0.97 mg/dL (ref 0.44–1.00)
GFR, Estimated: 58 mL/min — ABNORMAL LOW (ref >60)
Glucose, Bld: 116 mg/dL — ABNORMAL HIGH (ref 70–99)
Potassium: 3 mmol/L — ABNORMAL LOW (ref 3.5–5.1)
Sodium: 121 mmol/L — ABNORMAL LOW (ref 135–145)
Total Bilirubin: 1.2 mg/dL (ref 0.3–1.2)
Total Protein: 6 g/dL — ABNORMAL LOW (ref 6.5–8.1)

## 2022-06-27 LAB — LIPASE, BLOOD: Lipase: 30 U/L (ref 11–51)

## 2022-06-27 LAB — SARS CORONAVIRUS 2 BY RT PCR: SARS Coronavirus 2 by RT PCR: NEGATIVE

## 2022-06-27 MED ORDER — SODIUM CHLORIDE 0.9 % IV SOLN
100.0000 mg | Freq: Once | INTRAVENOUS | Status: AC
  Administered 2022-06-28: 100 mg via INTRAVENOUS
  Filled 2022-06-27: qty 100

## 2022-06-27 MED ORDER — ACETAMINOPHEN 325 MG PO TABS
650.0000 mg | ORAL_TABLET | Freq: Once | ORAL | Status: AC
  Administered 2022-06-28: 650 mg via ORAL
  Filled 2022-06-27: qty 2

## 2022-06-27 MED ORDER — SODIUM CHLORIDE 0.9 % IV BOLUS
1000.0000 mL | Freq: Once | INTRAVENOUS | Status: AC
  Administered 2022-06-27: 1000 mL via INTRAVENOUS

## 2022-06-27 MED ORDER — SODIUM CHLORIDE 0.9 % IV SOLN: Freq: Once | INTRAVENOUS | Status: AC

## 2022-06-27 NOTE — ED Notes (Signed)
Patient placed on bedpan in an attempt to urinate, patient unable to urinate.  Patient taken off bedpan.

## 2022-06-27 NOTE — ED Triage Notes (Signed)
Pt to triage via w/c with no distress; pt reports since Monday having frontal HA, nausea, diarrhea and weakness; denies abd pain; st went to UC but they tolder her to come to ED instead; pt accomp by family member

## 2022-06-28 DIAGNOSIS — D696 Thrombocytopenia, unspecified: Secondary | ICD-10-CM

## 2022-06-28 DIAGNOSIS — Z9841 Cataract extraction status, right eye: Secondary | ICD-10-CM | POA: Diagnosis not present

## 2022-06-28 DIAGNOSIS — G4730 Sleep apnea, unspecified: Secondary | ICD-10-CM | POA: Diagnosis present

## 2022-06-28 DIAGNOSIS — Z803 Family history of malignant neoplasm of breast: Secondary | ICD-10-CM | POA: Diagnosis not present

## 2022-06-28 DIAGNOSIS — I10 Essential (primary) hypertension: Secondary | ICD-10-CM | POA: Diagnosis present

## 2022-06-28 DIAGNOSIS — M810 Age-related osteoporosis without current pathological fracture: Secondary | ICD-10-CM | POA: Diagnosis present

## 2022-06-28 DIAGNOSIS — R7989 Other specified abnormal findings of blood chemistry: Secondary | ICD-10-CM | POA: Insufficient documentation

## 2022-06-28 DIAGNOSIS — W57XXXA Bitten or stung by nonvenomous insect and other nonvenomous arthropods, initial encounter: Secondary | ICD-10-CM | POA: Diagnosis present

## 2022-06-28 DIAGNOSIS — R7401 Elevation of levels of liver transaminase levels: Secondary | ICD-10-CM

## 2022-06-28 DIAGNOSIS — K219 Gastro-esophageal reflux disease without esophagitis: Secondary | ICD-10-CM | POA: Diagnosis not present

## 2022-06-28 DIAGNOSIS — R21 Rash and other nonspecific skin eruption: Secondary | ICD-10-CM | POA: Diagnosis present

## 2022-06-28 DIAGNOSIS — Z7982 Long term (current) use of aspirin: Secondary | ICD-10-CM | POA: Diagnosis not present

## 2022-06-28 DIAGNOSIS — Z1152 Encounter for screening for COVID-19: Secondary | ICD-10-CM | POA: Diagnosis not present

## 2022-06-28 DIAGNOSIS — Z9842 Cataract extraction status, left eye: Secondary | ICD-10-CM | POA: Diagnosis not present

## 2022-06-28 DIAGNOSIS — Z79899 Other long term (current) drug therapy: Secondary | ICD-10-CM | POA: Diagnosis not present

## 2022-06-28 DIAGNOSIS — R509 Fever, unspecified: Secondary | ICD-10-CM | POA: Diagnosis present

## 2022-06-28 DIAGNOSIS — B349 Viral infection, unspecified: Secondary | ICD-10-CM | POA: Diagnosis present

## 2022-06-28 DIAGNOSIS — E871 Hypo-osmolality and hyponatremia: Secondary | ICD-10-CM | POA: Insufficient documentation

## 2022-06-28 DIAGNOSIS — E876 Hypokalemia: Secondary | ICD-10-CM | POA: Diagnosis present

## 2022-06-28 DIAGNOSIS — E785 Hyperlipidemia, unspecified: Secondary | ICD-10-CM | POA: Diagnosis present

## 2022-06-28 DIAGNOSIS — Z7983 Long term (current) use of bisphosphonates: Secondary | ICD-10-CM | POA: Diagnosis not present

## 2022-06-28 DIAGNOSIS — I739 Peripheral vascular disease, unspecified: Secondary | ICD-10-CM | POA: Diagnosis present

## 2022-06-28 DIAGNOSIS — B348 Other viral infections of unspecified site: Secondary | ICD-10-CM | POA: Diagnosis present

## 2022-06-28 DIAGNOSIS — E861 Hypovolemia: Secondary | ICD-10-CM | POA: Diagnosis present

## 2022-06-28 DIAGNOSIS — Z8249 Family history of ischemic heart disease and other diseases of the circulatory system: Secondary | ICD-10-CM | POA: Diagnosis not present

## 2022-06-28 DIAGNOSIS — R0902 Hypoxemia: Secondary | ICD-10-CM | POA: Diagnosis not present

## 2022-06-28 DIAGNOSIS — I2489 Other forms of acute ischemic heart disease: Secondary | ICD-10-CM | POA: Diagnosis present

## 2022-06-28 DIAGNOSIS — I509 Heart failure, unspecified: Secondary | ICD-10-CM | POA: Diagnosis not present

## 2022-06-28 DIAGNOSIS — Z961 Presence of intraocular lens: Secondary | ICD-10-CM | POA: Diagnosis present

## 2022-06-28 LAB — COMPREHENSIVE METABOLIC PANEL
ALT: 30 U/L (ref 0–44)
AST: 44 U/L — ABNORMAL HIGH (ref 15–41)
Albumin: 3.1 g/dL — ABNORMAL LOW (ref 3.5–5.0)
Alkaline Phosphatase: 51 U/L (ref 38–126)
Anion gap: 11 (ref 5–15)
BUN: 16 mg/dL (ref 8–23)
CO2: 21 mmol/L — ABNORMAL LOW (ref 22–32)
Calcium: 7.7 mg/dL — ABNORMAL LOW (ref 8.9–10.3)
Chloride: 96 mmol/L — ABNORMAL LOW (ref 98–111)
Creatinine, Ser: 0.95 mg/dL (ref 0.44–1.00)
GFR, Estimated: 60 mL/min — ABNORMAL LOW (ref >60)
Glucose, Bld: 118 mg/dL — ABNORMAL HIGH (ref 70–99)
Potassium: 3.4 mmol/L — ABNORMAL LOW (ref 3.5–5.1)
Sodium: 126 mmol/L — ABNORMAL LOW (ref 135–145)
Total Bilirubin: 0.8 mg/dL (ref 0.3–1.2)
Total Protein: 5.9 g/dL — ABNORMAL LOW (ref 6.5–8.1)

## 2022-06-28 LAB — CBC WITH DIFFERENTIAL/PLATELET
Abs Immature Granulocytes: 0.08 10*3/uL — ABNORMAL HIGH (ref 0.00–0.07)
Basophils Absolute: 0 10*3/uL (ref 0.0–0.1)
Basophils Relative: 0 %
Eosinophils Absolute: 0 10*3/uL (ref 0.0–0.5)
Eosinophils Relative: 0 %
HCT: 30.8 % — ABNORMAL LOW (ref 36.0–46.0)
Hemoglobin: 10.7 g/dL — ABNORMAL LOW (ref 12.0–15.0)
Immature Granulocytes: 1 %
Lymphocytes Relative: 11 %
Lymphs Abs: 1 10*3/uL (ref 0.7–4.0)
MCH: 29.6 pg (ref 26.0–34.0)
MCHC: 34.7 g/dL (ref 30.0–36.0)
MCV: 85.1 fL (ref 80.0–100.0)
Monocytes Absolute: 0.3 10*3/uL (ref 0.1–1.0)
Monocytes Relative: 3 %
Neutro Abs: 7.8 10*3/uL — ABNORMAL HIGH (ref 1.7–7.7)
Neutrophils Relative %: 85 %
Platelets: 91 10*3/uL — ABNORMAL LOW (ref 150–400)
RBC: 3.62 MIL/uL — ABNORMAL LOW (ref 3.87–5.11)
RDW: 12.2 % (ref 11.5–15.5)
WBC: 9.3 10*3/uL (ref 4.0–10.5)
nRBC: 0 % (ref 0.0–0.2)

## 2022-06-28 LAB — URINALYSIS, ROUTINE W REFLEX MICROSCOPIC
Bilirubin Urine: NEGATIVE
Glucose, UA: NEGATIVE mg/dL
Hgb urine dipstick: NEGATIVE
Ketones, ur: NEGATIVE mg/dL
Leukocytes,Ua: NEGATIVE
Nitrite: NEGATIVE
Protein, ur: 30 mg/dL — AB
Specific Gravity, Urine: 1.008 (ref 1.005–1.030)
pH: 6 (ref 5.0–8.0)

## 2022-06-28 LAB — TECHNOLOGIST SMEAR REVIEW: Plt Morphology: NORMAL

## 2022-06-28 LAB — HIV ANTIBODY (ROUTINE TESTING W REFLEX): HIV Screen 4th Generation wRfx: NONREACTIVE

## 2022-06-28 LAB — MAGNESIUM: Magnesium: 2 mg/dL (ref 1.7–2.4)

## 2022-06-28 LAB — PROCALCITONIN: Procalcitonin: 0.93 ng/mL

## 2022-06-28 LAB — RPR: RPR Ser Ql: NONREACTIVE

## 2022-06-28 LAB — CULTURE, BLOOD (ROUTINE X 2): Culture: NO GROWTH

## 2022-06-28 MED ORDER — LOSARTAN POTASSIUM 50 MG PO TABS
50.0000 mg | ORAL_TABLET | Freq: Two times a day (BID) | ORAL | Status: DC
  Administered 2022-06-28 – 2022-07-02 (×9): 50 mg via ORAL
  Filled 2022-06-28 (×9): qty 1

## 2022-06-28 MED ORDER — VANCOMYCIN HCL 2000 MG/400ML IV SOLN
2000.0000 mg | Freq: Once | INTRAVENOUS | Status: DC
  Filled 2022-06-28: qty 400

## 2022-06-28 MED ORDER — HEPARIN SODIUM (PORCINE) 5000 UNIT/ML IJ SOLN
5000.0000 [IU] | Freq: Three times a day (TID) | INTRAMUSCULAR | Status: DC
  Administered 2022-06-28 – 2022-07-01 (×9): 5000 [IU] via SUBCUTANEOUS
  Filled 2022-06-28 (×9): qty 1

## 2022-06-28 MED ORDER — LOSARTAN POTASSIUM 50 MG PO TABS: 100.0000 mg | ORAL_TABLET | Freq: Two times a day (BID) | ORAL | Status: DC

## 2022-06-28 MED ORDER — SODIUM CHLORIDE 0.9 % IV SOLN
2.0000 g | INTRAVENOUS | Status: DC
  Administered 2022-06-28 (×2): 2 g via INTRAVENOUS
  Filled 2022-06-28 (×3): qty 2000
  Filled 2022-06-28: qty 2
  Filled 2022-06-28 (×2): qty 2000

## 2022-06-28 MED ORDER — SODIUM CHLORIDE 0.9 % IV SOLN
2.0000 g | Freq: Two times a day (BID) | INTRAVENOUS | Status: DC
  Filled 2022-06-28: qty 20

## 2022-06-28 MED ORDER — SODIUM CHLORIDE 0.9 % IV SOLN
2.0000 g | Freq: Two times a day (BID) | INTRAVENOUS | Status: DC
  Administered 2022-06-28: 2 g via INTRAVENOUS

## 2022-06-28 MED ORDER — OXYCODONE HCL 5 MG PO TABS: 5.0000 mg | ORAL_TABLET | ORAL | Status: DC | PRN

## 2022-06-28 MED ORDER — PANTOPRAZOLE SODIUM 40 MG PO TBEC
40.0000 mg | DELAYED_RELEASE_TABLET | Freq: Every day | ORAL | Status: DC
  Administered 2022-06-28 – 2022-07-02 (×5): 40 mg via ORAL
  Filled 2022-06-28 (×5): qty 1

## 2022-06-28 MED ORDER — HYDROCHLOROTHIAZIDE 12.5 MG PO TABS: 12.5000 mg | ORAL_TABLET | Freq: Every day | ORAL | Status: DC

## 2022-06-28 MED ORDER — ONDANSETRON HCL 4 MG PO TABS: 4.0000 mg | ORAL_TABLET | Freq: Four times a day (QID) | ORAL | Status: DC | PRN

## 2022-06-28 MED ORDER — ACETAMINOPHEN 650 MG RE SUPP: 650.0000 mg | Freq: Four times a day (QID) | RECTAL | Status: DC | PRN

## 2022-06-28 MED ORDER — POTASSIUM CHLORIDE 10 MEQ/100ML IV SOLN
10.0000 meq | INTRAVENOUS | Status: AC
  Administered 2022-06-28 (×4): 10 meq via INTRAVENOUS
  Filled 2022-06-28 (×4): qty 100

## 2022-06-28 MED ORDER — SODIUM CHLORIDE 0.9 % IV SOLN
2.0000 g | INTRAVENOUS | Status: DC
  Filled 2022-06-28 (×3): qty 2000

## 2022-06-28 MED ORDER — VANCOMYCIN HCL IN DEXTROSE 1-5 GM/200ML-% IV SOLN: 1000.0000 mg | Freq: Once | INTRAVENOUS | Status: DC

## 2022-06-28 MED ORDER — VANCOMYCIN HCL 2000 MG/400ML IV SOLN
2000.0000 mg | Freq: Once | INTRAVENOUS | Status: AC
  Administered 2022-06-28: 2000 mg via INTRAVENOUS
  Filled 2022-06-28: qty 400

## 2022-06-28 MED ORDER — MORPHINE SULFATE (PF) 2 MG/ML IV SOLN: 2.0000 mg | INTRAVENOUS | Status: DC | PRN

## 2022-06-28 MED ORDER — ATORVASTATIN CALCIUM 20 MG PO TABS
40.0000 mg | ORAL_TABLET | Freq: Every day | ORAL | Status: DC
  Administered 2022-06-28 – 2022-07-01 (×4): 40 mg via ORAL
  Filled 2022-06-28 (×4): qty 2

## 2022-06-28 MED ORDER — DEXAMETHASONE SODIUM PHOSPHATE 10 MG/ML IJ SOLN
10.0000 mg | Freq: Once | INTRAMUSCULAR | Status: AC
  Administered 2022-06-28: 10 mg via INTRAVENOUS
  Filled 2022-06-28: qty 1

## 2022-06-28 MED ORDER — HYDROCHLOROTHIAZIDE 25 MG PO TABS
25.0000 mg | ORAL_TABLET | Freq: Every day | ORAL | Status: DC
  Administered 2022-06-28: 25 mg via ORAL
  Filled 2022-06-28: qty 1

## 2022-06-28 MED ORDER — ASPIRIN 81 MG PO TBEC
81.0000 mg | DELAYED_RELEASE_TABLET | Freq: Every day | ORAL | Status: DC
  Administered 2022-06-28 – 2022-07-02 (×5): 81 mg via ORAL
  Filled 2022-06-28 (×5): qty 1

## 2022-06-28 MED ORDER — ONDANSETRON HCL 4 MG/2ML IJ SOLN
4.0000 mg | Freq: Four times a day (QID) | INTRAMUSCULAR | Status: DC | PRN
  Administered 2022-06-28: 4 mg via INTRAVENOUS
  Filled 2022-06-28: qty 2

## 2022-06-28 MED ORDER — ACETAMINOPHEN 325 MG PO TABS
650.0000 mg | ORAL_TABLET | Freq: Four times a day (QID) | ORAL | Status: DC | PRN
  Administered 2022-06-28 – 2022-07-01 (×6): 650 mg via ORAL
  Filled 2022-06-28 (×6): qty 2

## 2022-06-28 MED ORDER — SODIUM CHLORIDE 0.9 % IV SOLN: 2.0000 g | Freq: Once | INTRAVENOUS | Status: DC

## 2022-06-28 MED ORDER — SODIUM CHLORIDE 0.9 % IV SOLN
100.0000 mg | Freq: Two times a day (BID) | INTRAVENOUS | Status: DC
  Administered 2022-06-28 – 2022-06-29 (×3): 100 mg via INTRAVENOUS
  Filled 2022-06-28 (×4): qty 100

## 2022-06-28 MED ORDER — SODIUM CHLORIDE 0.9 % IV SOLN: INTRAVENOUS | Status: DC

## 2022-06-28 NOTE — ED Notes (Signed)
Only able to obtain 1 set of blood cultures due to patient being a hard stick.  MD Erma Heritage made aware.

## 2022-06-28 NOTE — H&P (Signed)
History and Physical    Patient: Gabrielle Morris ONG:[id number] DOB: 07/01/1940 DOA: 06/27/2022 DOS: the patient was seen and examined on 06/28/2022 PCP: Lynnea Ferrier, MD  Patient coming from: Home  Chief Complaint:  Chief Complaint  Patient presents with   Headache   HPI: Gabrielle Morris is a 82 y.o. female with medical history significant of with history of GERD, HLD, HTN, PAD, and more presents to the ED with a chief complaint of headahce, nausea, diarrhea, and weakness. She presented to the urgent care first, but they sent her here. She reports the headache is diffuse and feels like pressure and ache. She reports that tylenol has offered minimal relief at home. She has also had a fever at home. The T max was 101.4. She has had a decreased appetite and myalgias. She noticed a rash on her arms 2 days ago. The other symptoms started about 4-5 days ago. She reports that she had 4 tick bites last month. She was working in her garden when she picked up the ticks. She noticed one of them 2-3 days later behind her knee, and another the next day on her other leg. Several days went by before she discovered two ticks hidden under her pannus. She is not sure how long those two were there. She saw her PCP two days ago. She was prescribed doxy. She has had 3 doses. She reports no improvement in her headache or myalgias. She has had worsening nausea since taking the doxy. Patient has no other complaints at this time.   Patient does not smoke, does not drink. She is vaxed for covid. Patient is full code.   Review of Systems: As mentioned in the history of present illness. All other systems reviewed and are negative. Past Medical History:  Diagnosis Date   Arthritis    JOINT STIFFNESS   Bilateral carpal tunnel syndrome    Dysrhythmia    PVC'S  LAST YEAR/ NONE RECENTLY/DR KOWALSKY    Fibrocystic breast disease    GERD (gastroesophageal reflux disease)    Hyperlipidemia    Hypertension     CONTROLLED ON MEDS   Neutropenia    Onychomycosis    Osteoporosis    Premature ventricular contractions    DR KOWALSKY CONSULTED, EKG IN JAN NEXT ONE IN JULY   Reflux    Right carotid artery occlusion    /SLIGHT NARROWING   Sleep apnea    Thrombocytopenia (HCC)    mild   Past Surgical History:  Procedure Laterality Date   BREAST EXCISIONAL BIOPSY Left [id number]   neg   CARDIAC CATHETERIZATION  2005   negatvie CAD, right partial carotid artery blockage   CATARACT EXTRACTION Right 2010   CATARACT EXTRACTION W/PHACO Left 09/01/2014   Procedure: CATARACT EXTRACTION PHACO AND INTRAOCULAR LENS PLACEMENT (IOC);  Surgeon: Lockie Mola, MD;  Location: [facility];  Service: Ophthalmology;  Laterality: Left;   COLONOSCOPY WITH PROPOFOL N/A 03/17/2015   Procedure: COLONOSCOPY WITH PROPOFOL;  Surgeon: Scot Jun, MD;  Location: [facility];  Service: Endoscopy;  Laterality: N/A;   COLONOSCOPY WITH PROPOFOL N/A 08/15/2020   Procedure: COLONOSCOPY WITH PROPOFOL;  Surgeon: Regis Bill, MD;  Location: [facility] ENDOSCOPY;  Service: Endoscopy;  Laterality: N/A;   KYPHOPLASTY N/A 11/08/2017   Procedure: [id number];  Surgeon: Kennedy Bucker, MD;  Location: [facility] ORS;  Service: Orthopedics;  Laterality: N/A;   PATELLA FRACTURE SURGERY Right 2013   HARDWARE REMOVED 2015 SEPERATE SURGERY   Social History:  reports that she has never smoked. She has never used smokeless tobacco. She reports that she does not drink alcohol and does not use drugs.  No Known Allergies  Family History  Problem Relation Age of Onset   Heart disease Other    Hypertension Other    Hypertension Maternal Aunt    Breast cancer Maternal Aunt    Hypertension Paternal Aunt    Breast cancer Paternal Aunt     Prior to Admission medications   Medication Sig Start Date End Date Taking? Authorizing Provider  doxycycline (VIBRAMYCIN) 100 MG capsule Take by mouth. 06/26/22 07/06/22 Yes [provider]  metaxalone (SKELAXIN) 800 MG tablet Take 400 mg by mouth 3 (three) times daily as needed. 02/09/22  Yes [provider]  ondansetron (ZOFRAN-ODT) 4 MG disintegrating tablet Take by mouth. 06/27/22 07/04/22 Yes [provider]  potassium chloride SA (KLOR-CON M) 20 MEQ tablet Take by mouth. 06/26/22 07/03/22 Yes [provider]  predniSONE (DELTASONE) 10 MG tablet Take 10 mg by mouth 2 (two) times daily. 02/09/22  Yes [provider]  traMADol (ULTRAM) 50 MG tablet Take 50 mg by mouth every 6 (six) hours as needed. 02/09/22  Yes [provider]  acetaminophen (TYLENOL) 500 MG tablet Take 500 mg by mouth every 6 (six) hours as needed for mild pain.     [provider]  alendronate (FOSAMAX) 70 MG tablet Take by mouth. 03/09/20   [provider]  aspirin 81 MG EC tablet Take 81 mg by mouth daily. am    [provider]  atorvastatin (LIPITOR) 40 MG tablet Take 40 mg by mouth at bedtime.    [provider]  Calcium Citrate-Vitamin D 315-250 MG-UNIT TABS Take 1 tablet by mouth daily.     [provider]  Cholecalciferol (VITAMIN D3) 2000 UNITS TABS Take 2,000 Units by mouth every morning. AM    [provider]  cyanocobalamin (,VITAMIN B-12,) 1000 MCG/ML injection Inject into the muscle every 30 (thirty) days. 06/05/20   [provider]  donepezil (ARICEPT) 5 MG tablet Take 5 mg by mouth daily. 06/05/20   [provider]  fluticasone (FLONASE) 50 MCG/ACT nasal spray Place 2 sprays into the nose as needed for allergies. Pm    [provider]  hydrochlorothiazide (HYDRODIURIL) 12.5 MG tablet Take 12.5 mg by mouth daily.  04/04/16   [provider]  losartan (COZAAR) 100 MG tablet Take by mouth 2 (two) times daily. 05/22/16   [provider]  omeprazole (PRILOSEC) 20 MG capsule Take 20 mg by mouth daily.    [provider]    Physical Exam: Vitals:   06/27/22  2004 06/27/22 2215 06/27/22 2307 06/27/22 2309  BP: (!) 123/51 (!) 141/56  (!) 137/57  Pulse: 72 74  79  Resp: 20 20  19   Temp: (!) 100.4 F (38 C) 99.8 F (37.7 C) 99.8 F (37.7 C) 99.8 F (37.7 C)  TempSrc: Oral Oral Oral Oral  SpO2: 98% 100%  98%  Weight:      Height:       1.  General: Patient lying supine in bed,  no acute distress   2. Psychiatric: Alert and oriented x 3, mood and behavior normal for situation, pleasant and cooperative with exam   3. Neurologic: Speech and language are normal, face is symmetric, moves all 4 extremities voluntarily, at baseline without acute deficits on limited exam   4. HEENMT:  Head is atraumatic, normocephalic, pupils  reactive to light, neck is supple, trachea is midline, mucous membranes are moist   5. Respiratory : Lungs are clear to auscultation bilaterally without wheezing, rhonchi, rales, no cyanosis, no increase in work of breathing or accessory muscle use   6. Cardiovascular : Heart rate normal, rhythm is regular, no murmurs, rubs or gallops, no peripheral edema, peripheral pulses palpated   7. Gastrointestinal:  Abdomen is soft, nondistended, nontender to palpation bowel sounds active, no masses or organomegaly palpated   8. Skin:  Petechial rash on arms and trunk  9.Musculoskeletal:  No acute deformities or trauma, no asymmetry in tone, no peripheral edema, peripheral pulses palpated, no tenderness to palpation in the extremities  Data Reviewed: In the ED Tmax 100.4, tylenol given No leukocytosis Hypokalemia 3.0 Trop 27,28 CT head = no acute findings Blood culture pending Patient covered with empiric for meningitis due to having headache and febrile illness.  Assessment and Plan: * Febrile illness, acute -Tmax 101.4, Fever today 100.4 -History of 4 tick bites last month, two of them were on for an unknown amount of time more than 3 days -Petechial rash on forearms and trunk -Doxycycline started outpatient, but  patient has felt no better -IV doxycycline here  -consult infectious disease -Check UA -No respiratory symptoms -No sick contacts -Blood culture pending -1L NS bolus, tylenol given in ED -Covering with empiric antibiotics for meningitis until Infectious disease sees patient  -No meningeal signs, but does complain of constant headache -Continue to monitor  Elevated troponin -Trop flat 27, 28 -EKG = NSR with Qtc 395, no acute ischemic changes -No chest pain -Monitor on tele  GERD (gastroesophageal reflux disease) -Continue PPI   Hypokalemia -Potassium 3.0 - K+ through the night -recheck in the AM -Continue to monitor  Hyponatremia -Na 121 -It was last 134 in 2019 -Continue NS IV hydration for hypovolemic hyponatremia -Trend with AM labs  HTN (hypertension) -Continue HCTZ and ARB  PAD (peripheral artery disease) (HCC) -Continue Asa and statin      Advance Care Planning:   Code Status: Full Code   Consults: infectious disease  Family Communication: no family at bedside  Severity of Illness: The appropriate patient status for this patient is INPATIENT. Inpatient status is judged to be reasonable and necessary in order to provide the required intensity of service to ensure the patient's safety. The patient's presenting symptoms, physical exam findings, and initial radiographic and laboratory data in the context of their chronic comorbidities is felt to place them at high risk for further clinical deterioration. Furthermore, it is not anticipated that the patient will be medically stable for discharge from the hospital within 2 midnights of admission.   * I certify that at the point of admission it is my clinical judgment that the patient will require inpatient hospital care spanning beyond 2 midnights from the point of admission due to high intensity of service, high risk for further deterioration and high frequency of surveillance required.*  Author: Lilyan Gilford, DO 06/28/2022 3:05 AM  For on call review [email or web address].

## 2022-06-28 NOTE — Progress Notes (Signed)
PHARMACY -  BRIEF ANTIBIOTIC NOTE   Pharmacy has received consult(s) for Vancomycin from an ED provider.  The patient's profile has been reviewed for ht/wt/allergies/indication/available labs.    One time order(s) placed for Vancomycin 2 gm per pt wt: 94.8 kg  Further antibiotics/pharmacy consults should be ordered by admitting physician if indicated.                       Thank you, Otelia Sergeant, PharmD, [facility] 06/28/2022 12:42 AM

## 2022-06-28 NOTE — Assessment & Plan Note (Signed)
-  Trop flat 27, 28 -EKG = NSR with Qtc 395, no acute ischemic changes -No chest pain -Monitor on tele

## 2022-06-28 NOTE — ED Provider Notes (Signed)
[facility] Provider Note    Event Date/Time   First MD Initiated Contact with Patient 06/27/22 2303     (approximate)     [facility] Provider Note    Event Date/Time   First MD Initiated Contact with Patient 06/27/22 2303     (approximate)   History   Headache   HPI  Gabrielle Morris is a 82 y.o. female  here with headache, arthralgias, rash, fatigue, nv. Pt states she was dx with RMSF two days ago, has had 3 doses of doxy. Had IVF at PCP today and since then has just had worsening weakness, fatigue, fevers, chills. She has had a generalized throbbing HA. No neck stiffness. She has broken out in a rash worse on her hands and trunk. She has had multiple tick exposures.       Physical Exam   Triage Vital Signs: ED Triage Vitals  Enc Vitals Group     BP 06/27/22 2004 (!) 123/51     Pulse Rate 06/27/22 2004 72     Resp 06/27/22 2004 20     Temp 06/27/22 2004 (!) 100.4 F (38 C)     Temp Source 06/27/22 2004 Oral     SpO2 06/27/22 2004 98 %     Weight 06/27/22 1949 209 lb (94.8 kg)     Height 06/27/22 1949 5\' 4"  (1.626 m)     Head Circumference --      Peak Flow --      Pain Score 06/27/22 1948 10     Pain Loc --      Pain Edu? --      Excl. in GC? --     Most recent vital signs: Vitals:   06/28/22 0556 06/28/22 0600  BP:  (!) 118/43  Pulse:  79  Resp:  (!) 25  Temp: 98.9 F (37.2 C)   SpO2:  94%     General: Awake, no distress.  CV:  Good peripheral perfusion. RRR. Resp:  Normal work of breathing. Lungs clear. Abd:  No distention.  Other:  Mildly dry MM. Alert, oriented, no focal deficits. Maculopapular rash to bilateral hands and trunk.   ED Results / Procedures / Treatments   Labs (all labs ordered are listed, but only abnormal results are displayed) Labs Reviewed  CBC WITH DIFFERENTIAL/PLATELET - Abnormal; Notable for the following components:      Result Value   Hemoglobin 11.6 (*)     HCT 32.8 (*)    Platelets 94 (*)    All other components within normal limits  COMPREHENSIVE METABOLIC PANEL - Abnormal; Notable for the following components:   Sodium 121 (*)    Potassium 3.0 (*)    Chloride 84 (*)    Glucose, Bld 116 (*)    Calcium 8.2 (*)    Total Protein 6.0 (*)    AST 46 (*)    GFR, Estimated 58 (*)    All other components within normal limits  CBC WITH DIFFERENTIAL/PLATELET - Abnormal; Notable for the following components:   RBC 3.62 (*)    Hemoglobin 10.7 (*)    HCT 30.8 (*)    Platelets 91 (*)    Neutro Abs 7.8 (*)    Abs Immature Granulocytes 0.08 (*)    All other components within normal limits  URINALYSIS, ROUTINE W REFLEX MICROSCOPIC - Abnormal; Notable for the following components:   Color, Urine YELLOW (*)    APPearance CLEAR (*)  Protein, ur 30 (*)    Bacteria, UA RARE (*)    All other components within normal limits  TROPONIN I (HIGH SENSITIVITY) - Abnormal; Notable for the following components:   Troponin I (High Sensitivity) 27 (*)    All other components within normal limits  TROPONIN I (HIGH SENSITIVITY) - Abnormal; Notable for the following components:   Troponin I (High Sensitivity) 28 (*)    All other components within normal limits  SARS CORONAVIRUS 2 BY RT PCR  CULTURE, BLOOD (ROUTINE X 2)  CULTURE, BLOOD (ROUTINE X 2)  LIPASE, BLOOD  SPOTTED FEVER GROUP ANTIBODIES  HIV ANTIBODY (ROUTINE TESTING W REFLEX)  RPR  COMPREHENSIVE METABOLIC PANEL  MAGNESIUM     EKG Normal sinus rhythm, VR 71. PR 178, QRS 88, QTc 395.    RADIOLOGY CXR: Clear   I also independently reviewed and agree with radiologist interpretations.   PROCEDURES:  Critical Care performed: Yes, see critical care procedure note(s)  .Critical Care  Performed by: Shaune Pollack, MD Authorized by: Shaune Pollack, MD   Critical care provider statement:    Critical care time (minutes):  30   Critical care time was exclusive of:  Separately billable  procedures and treating other patients   Critical care was necessary to treat or prevent imminent or life-threatening deterioration of the following conditions:  Cardiac failure, circulatory failure and sepsis   Critical care was time spent personally by me on the following activities:  Development of treatment plan with patient or surrogate, discussions with consultants, evaluation of patient's response to treatment, examination of patient, ordering and review of laboratory studies, ordering and review of radiographic studies, ordering and performing treatments and interventions, pulse oximetry, re-evaluation of patient's condition and review of old charts     MEDICATIONS ORDERED IN ED: Medications  doxycycline (VIBRAMYCIN) 100 mg in sodium chloride 0.9 % 250 mL IVPB (has no administration in time range)  aspirin EC tablet 81 mg (has no administration in time range)  atorvastatin (LIPITOR) tablet 40 mg (has no administration in time range)  pantoprazole (PROTONIX) EC tablet 40 mg (has no administration in time range)  potassium chloride 10 mEq in 100 mL IVPB (10 mEq Intravenous New Bag/Given 06/28/22 0611)  0.9 %  sodium chloride infusion ( Intravenous New Bag/Given 06/28/22 0354)  heparin injection 5,000 Units (5,000 Units Subcutaneous Given 06/28/22 0611)  acetaminophen (TYLENOL) tablet 650 mg (has no administration in time range)    Or  acetaminophen (TYLENOL) suppository 650 mg (has no administration in time range)  oxyCODONE (Oxy IR/ROXICODONE) immediate release tablet 5 mg (has no administration in time range)  morphine (PF) 2 MG/ML injection 2 mg (has no administration in time range)  ondansetron (ZOFRAN) tablet 4 mg (has no administration in time range)    Or  ondansetron (ZOFRAN) injection 4 mg (has no administration in time range)  ampicillin (OMNIPEN) 2 g in sodium chloride 0.9 % 100 mL IVPB (0 g Intravenous Stopped 06/28/22 0333)  cefTRIAXone (ROCEPHIN) 2 g in sodium chloride 0.9 % 100  mL IVPB (0 g Intravenous Stopped 06/28/22 0310)  losartan (COZAAR) tablet 50 mg (has no administration in time range)  hydrochlorothiazide (HYDRODIURIL) tablet 25 mg (has no administration in time range)  doxycycline (VIBRAMYCIN) 100 mg in sodium chloride 0.9 % 250 mL IVPB (0 mg Intravenous Stopped 06/28/22 0304)  sodium chloride 0.9 % bolus 1,000 mL (0 mLs Intravenous Stopped 06/28/22 0059)  0.9 %  sodium chloride infusion ( Intravenous New Bag/Given 06/28/22 0057)  acetaminophen (TYLENOL) tablet 650 mg (650 mg Oral Given 06/28/22 0020)  dexamethasone (DECADRON) injection 10 mg (10 mg Intravenous Given 06/28/22 0109)  vancomycin (VANCOREADY) IVPB 2000 mg/400 mL (0 mg Intravenous Stopped 06/28/22 0505)     IMPRESSION / MDM / ASSESSMENT AND PLAN / ED COURSE  I reviewed the triage vital signs and the nursing notes.                              Differential diagnosis includes, but is not limited to, RMSF, viral illness, other sepsis, PNA, unlikely meningitis  Patient's presentation is most consistent with acute presentation with potential threat to life or bodily function.  The patient is on the cardiac monitor to evaluate for evidence of arrhythmia and/or significant heart rate changes  82 yo F here with fever, fatigue, papular rash, tick exposure. History and exam is highly c/w RMSF with hyponatremia. She is hypovolemic on exam. Will start empiric Doxy, IVF, and admit to medicine. No seizures, no AMS. No severe headache, neck stiffness/rigidity or signs to suggest other bacterial meningitis or encephalitis. No focal neuro deficits. Ct head is negative. No signs of PNA.  Given the lack of confirmed RMSF with petechiael type rash and headache, will cover her empirically for meningitis at this time though clinically sx are highly c/w RMSF. Given that she has already been given ABX as outpt, do not feel this needs to wait for LP/Cx at this time. May benefit from ID c/s at which point decision re: LP could be  made. CT head is negative.    FINAL CLINICAL IMPRESSION(S) / ED DIAGNOSES   Final diagnoses:  RMSF [facility] spotted fever)  Hyponatremia     Rx / DC Orders   ED Discharge Orders     None        Note:  This document was prepared using Dragon voice recognition software and may include unintentional dictation errors.   Shaune Pollack, MD 06/28/22 [id number]

## 2022-06-28 NOTE — Consult Note (Signed)
[facility]                                                                                        Patient Identification: Patient Name: Gabrielle Morris MRN: [id number] Admit Date: 06/27/2022 10:54 PM Today's Date: 06/28/2022 Reason for consult:  Requesting provider:   Principal Problem:   Febrile illness, acute Active Problems:   PAD (peripheral artery disease) (HCC)   HTN (hypertension)   Hyponatremia   Hypokalemia   GERD (gastroesophageal reflux disease)   Elevated troponin   Antibiotics:  Ampicillin 5/1 Ceftriaxone 5/1 Vancomycin 5/1 Doxycycline   Lines/Hardware:  Assessment Fever/headache/rash Tick bite/concerns for tick borne illness Thrombocytopenia Transaminitis   Clinically concerning for tick borne illness like RMSF, Ehrlichia, less likely Lyme ( no travel h/o endemic regions).  Headache and neck soreness along with rashes has resolved. No meningismus/neck stiffness. Less likely meningitis and hence, no need to pursue LP  Recommendations  Continue doxycycline, dc remaining abtx as less likely bacterial meningitis  Fu RMSF serology, will add Ehrlichia as well as Lyme serology  HCV and HBV serology  Peripheral blood smear to see inclusion bodies in WBC  Following  Rest of the management as per the primary team. Please call with questions or concerns.  Thank you for the consult  __________________________________________________________________________________________________________ HPI and Hospital Course: 82 Y O female with PMH as below including fibrocystic breast disease, HLD, HTN, osteoporosis, right carotid artery occlusion, sleep apnea who presented to the ED/2 for concern of tick related infection. Niece at bedside. Patient reports neck was sore Saturday, she worked in her garden Saturday and later developed headache in the evening. She also reports  removing 4 ticks from her body after working in garden.  Had chills Sunday, her tempr was 104 on Monday. She also had double vision on Monday and Tuesday. Also noticed rashes on lower abdomen and bilateral thighs. Seen by PCP 4/30 where labs remarkable for hyponatremia, hypokalemia and thrombocytopenia. She was prescribed doxycycline on Tuesday as well as gave IVF on Wednesday and recommended to come to ED.   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    ROS: General- Denies  loss of appetite and loss of weight HEENT - Headache, blurry vision, neck pain, all have resolved  Chest - Denies any chest pain, SOB or cough CVS- Denies any dizziness/lightheadedness, syncopal attacks, palpitations Abdomen- Denies any nausea, vomiting, abdominal pain, hematochezia and diarrhea Neuro - Denies any weakness, numbness, tingling sensation Psych - Denies any changes in mood irritability or depressive symptoms GU- Denies any burning, dysuria, hematuria or increased frequency of urination MSK - denies any joint pain/swelling or restricted ROM   Past Medical History:  Diagnosis Date   Arthritis    JOINT STIFFNESS   Bilateral carpal tunnel syndrome    Dysrhythmia    PVC'S  LAST YEAR/ NONE RECENTLY/DR KOWALSKY    Fibrocystic breast disease    GERD (gastroesophageal reflux disease)    Hyperlipidemia    Hypertension    CONTROLLED ON MEDS   Neutropenia    Onychomycosis    Osteoporosis    Premature ventricular contractions    DR KOWALSKY CONSULTED, EKG IN JAN NEXT ONE IN JULY   Reflux     Right carotid artery occlusion    /SLIGHT NARROWING   Sleep apnea    Thrombocytopenia (HCC)    mild   Past Surgical History:  Procedure Laterality Date   BREAST EXCISIONAL BIOPSY Left 1610,9604   neg   CARDIAC CATHETERIZATION  2005   negatvie CAD, right partial carotid artery blockage   CATARACT EXTRACTION Right 2010   CATARACT EXTRACTION W/PHACO Left 09/01/2014   Procedure: CATARACT EXTRACTION PHACO AND INTRAOCULAR LENS PLACEMENT (IOC);  Surgeon: Lockie Mola, MD;  Location: [facility];  Service: Ophthalmology;  Laterality: Left;   COLONOSCOPY WITH PROPOFOL N/A 03/17/2015   Procedure: COLONOSCOPY WITH PROPOFOL;  Surgeon: Scot Jun, MD;  Location: [facility];  Service: Endoscopy;  Laterality: N/A;   COLONOSCOPY WITH PROPOFOL N/A 08/15/2020   Procedure: COLONOSCOPY WITH PROPOFOL;  Surgeon: Regis Bill, MD;  Location: [facility];  Service: Endoscopy;  Laterality: N/A;   KYPHOPLASTY N/A 11/08/2017   Procedure: [id number];  Surgeon: Kennedy Bucker, MD;  Location: [facility];  Service: Orthopedics;  Laterality: N/A;   PATELLA FRACTURE SURGERY Right 2013   HARDWARE REMOVED 2015 SEPERATE SURGERY    Scheduled Meds:  aspirin EC  81 mg Oral Daily   atorvastatin  40 mg Oral QHS   heparin  5,000 Units Subcutaneous Q8H   hydrochlorothiazide  25 mg Oral Daily   losartan  50 mg Oral BID   pantoprazole  40 mg Oral Daily   Continuous Infusions:  sodium chloride 75 mL/hr at 06/28/22 0354   ampicillin (OMNIPEN) IV Stopped (06/28/22 1057)   cefTRIAXone (ROCEPHIN)  IV Stopped (06/28/22 0310)   doxycycline (VIBRAMYCIN) IV 100 mg (06/28/22 1056)   PRN Meds:.acetaminophen **OR** acetaminophen, morphine injection, ondansetron **OR** ondansetron (ZOFRAN) IV, oxyCODONE  No Known Allergies  Social History   Socioeconomic History   Marital status: Single    Spouse name: Not on file   Number of children: Not on file   Years of education: Not on file   Highest  education level: Not on file  Occupational History   Not on file  Tobacco Use   Smoking status: Never   Smokeless tobacco: Never  Vaping Use   Vaping Use: Never used  Substance and Sexual Activity   Alcohol use: No   Drug use: No   Sexual activity: Not on file  Other Topics Concern   Not on file  Social History Narrative   Not on file   Social Determinants of Health   Financial Resource Strain: Not on file  Food Insecurity: Not on file  Transportation Needs: Not on file  Physical Activity: Not on file  Stress: Not on file  Social Connections: Not on file  Intimate Partner Violence: Not on file   Family History  Problem Relation Age of Onset   Heart disease Other    Hypertension Other    Hypertension Maternal Aunt    Breast cancer Maternal Aunt    Hypertension Paternal Aunt  Breast cancer Paternal Aunt     Vitals BP (!) 142/74   Pulse 80   Temp 97.7 F (36.5 C)   Resp 20   Ht 5\' 4"  (1.626 m)   Wt 94.8 kg   SpO2 97%   BMI 35.87 kg/m   Physical Exam Constitutional:  elderly female lying in the bed, not in acute distress    Comments: HEENT within normal limit  Cardiovascular:     Rate and Rhythm: Normal rate and regular rhythm.     Heart sounds: S1 and S2  Pulmonary:     Effort: Pulmonary effort is normal on room air    Comments: Normal breath sounds  Abdominal:     Palpations: Abdomen is soft.     Tenderness: Nondistended and nontender  Musculoskeletal:        General: No swelling or tenderness in peripheral joints  Skin:    Comments: No rashes including lower abdomen and thighs  Neurological:     General: Awake, alert and oriented, grossly nonfocal follows commands  Psychiatric:        Mood and Affect: Mood normal.    Pertinent Microbiology Results for orders placed or performed during the hospital encounter of 06/27/22  SARS Coronavirus 2 by RT PCR (hospital order, performed in [facility] hospital lab) *cepheid single result test*  Anterior Nasal Swab     Status: None   Collection Time: 06/27/22  8:07 PM   Specimen: Anterior Nasal Swab  Result Value Ref Range Status   SARS Coronavirus 2 by RT PCR NEGATIVE NEGATIVE Final    Comment: (NOTE) SARS-CoV-2 target nucleic acids are NOT DETECTED.  The SARS-CoV-2 RNA is generally detectable in upper and lower respiratory specimens during the acute phase of infection. The lowest concentration of SARS-CoV-2 viral copies this assay can detect is 250 copies / mL. A negative result does not preclude SARS-CoV-2 infection and should not be used as the sole basis for treatment or other patient management decisions.  A negative result may occur with improper specimen collection / handling, submission of specimen other than nasopharyngeal swab, presence of viral mutation(s) within the areas targeted by this assay, and inadequate number of viral copies (<250 copies / mL). A negative result must be combined with clinical observations, patient history, and epidemiological information.  Fact Sheet for Patients:   RoadLapTop.co.za  Fact Sheet for Healthcare Providers: http://kim-miller.com/  This test is not yet approved or  cleared by the [place] FDA and has been authorized for detection and/or diagnosis of SARS-CoV-2 by FDA under an Emergency Use Authorization (EUA).  This EUA will remain in effect (meaning this test can be used) for the duration of the COVID-19 declaration under Section 564(b)(1) of the Act, 21 U.S.C. section 360bbb-3(b)(1), unless the authorization is terminated or revoked sooner.  Performed at [facility], [address] [place], [place] [address]   Blood culture (routine x 2)     Status: None (Preliminary result)   Collection Time: 06/27/22 11:55 PM   Specimen: BLOOD  Result Value Ref Range Status   Specimen Description BLOOD LEFT AC  Final   Special Requests   Final    BOTTLES DRAWN AEROBIC  AND ANAEROBIC Blood Culture adequate volume   Culture   Final    NO GROWTH < 12 HOURS Performed at [facility], [address] [place], [place] [address]    Report Status PENDING  Incomplete   Pertinent Lab seen by me:    Latest  Ref Rng & Units 06/28/2022    5:59 AM 06/27/2022    8:06 PM 11/08/2017   10:31 AM  CBC  WBC 4.0 - 10.5 K/uL 9.3  8.3  9.5   Hemoglobin 12.0 - 15.0 g/dL 16.1  09.6  04.5   Hematocrit 36.0 - 46.0 % 30.8  32.8  37.4   Platelets 150 - 400 K/uL 91  94  222       Latest Ref Rng & Units 06/28/2022    5:59 AM 06/27/2022    8:06 PM 11/08/2017   10:31 AM  CMP  Glucose 70 - 99 mg/dL 409  811  914   BUN 8 - 23 mg/dL 16  14  15    Creatinine 0.44 - 1.00 mg/dL 7.82  9.56  2.13   Sodium 135 - 145 mmol/L 126  121  134   Potassium 3.5 - 5.1 mmol/L 3.4  3.0  3.2   Chloride 98 - 111 mmol/L 96  84  98   CO2 22 - 32 mmol/L 21  24  29    Calcium 8.9 - 10.3 mg/dL 7.7  8.2  9.3   Total Protein 6.5 - 8.1 g/dL 5.9  6.0    Total Bilirubin 0.3 - 1.2 mg/dL 0.8  1.2    Alkaline Phos 38 - 126 U/L 51  55    AST 15 - 41 U/L 44  46    ALT 0 - 44 U/L 30  29       Pertinent Imagings/Other Imagings Plain films and CT images have been personally visualized and interpreted; radiology reports have been reviewed. Decision making incorporated into the Impression / Recommendations.  CT Head Wo Contrast  Result Date: 06/27/2022 CLINICAL DATA:  Headache EXAM: CT HEAD WITHOUT CONTRAST TECHNIQUE: Contiguous axial images were obtained from the base of the skull through the vertex without intravenous contrast. RADIATION DOSE REDUCTION: This exam was performed according to the departmental dose-optimization program which includes automated exposure control, adjustment of the mA and/or kV according to patient size and/or use of iterative reconstruction technique. COMPARISON:  MRI head 05/08/2020 FINDINGS: Brain: No evidence of acute infarction, hemorrhage, hydrocephalus, extra-axial collection or  mass lesion/mass effect. Vascular: Atherosclerotic calcifications are present within the cavernous internal carotid arteries. Skull: Normal. Negative for fracture or focal lesion. Sinuses/Orbits: No acute finding. Other: None. IMPRESSION: No acute intracranial abnormality. Electronically Signed   By: Darliss Cheney M.D.   On: 06/27/2022 20:49    I have personally spent 100 minutes involved in face-to-face and non-face-to-face activities for this patient on the day of the visit. Professional time spent includes the following activities: Preparing to see the patient (review of tests), Obtaining and/or reviewing separately obtained history (admission/discharge record), Performing a medically appropriate examination and/or evaluation , Ordering medications/tests/procedures, referring and communicating with other health care professionals, Documenting clinical information in the EMR, Independently interpreting results (not separately reported), Communicating results to the patient/family/caregiver, Counseling and educating the patient/family/caregiver and Care coordination (not sElderly female lying in the bedeparately reported).  Electronically signed by:   Plan d/w requesting provider as well as ID pharm D  Note: This document was prepared using dragon voice recognition software and may include unintentional dictation errors.   Odette Fraction, MD Infectious Disease Physician [facility] Pager: [phone]   elderly female lying in the bed

## 2022-06-28 NOTE — ED Notes (Signed)
Request made for transport to the floor ?

## 2022-06-28 NOTE — Progress Notes (Signed)
Brief hospitalist update note.  This is a nonbillable note.  Please see same-day H&P for full billable details.  Briefly, this is a 82 year old female who presents to the ED with chief complaint of headache, nausea, diarrhea, fever, weakness.  Patient has a history of tick exposure.  Tmax at home 101.4.  Symptoms started 5 days prior to admission.  4 tick bites noted last months.  Was prescribed outpatient doxycycline and had 3 doses.  No improvement in symptoms.  Presented to the ED.  Is currently on broad-spectrum IV antibiotics.  Infectious disease consultation requested.  Recommendations greatly appreciated.  Continue broad antimicrobial coverage until formal ID evaluation.  Lolita Patella MD  No charge

## 2022-06-28 NOTE — Assessment & Plan Note (Signed)
-  Na 121 -It was last 134 in 2019 -Continue NS IV hydration for hypovolemic hyponatremia -Trend with AM labs

## 2022-06-28 NOTE — Assessment & Plan Note (Signed)
Continue PPI ?

## 2022-06-28 NOTE — Assessment & Plan Note (Signed)
-  Tmax 101.4, Fever today 100.4 -History of 4 tick bites last month, two of them were on for an unknown amount of time more than 3 days -Petechial rash on forearms and trunk -Doxycycline started outpatient, but patient has felt no better -IV doxycycline here  -consult infectious disease -Check UA -No respiratory symptoms -No sick contacts -Blood culture pending -1L NS bolus, tylenol given in ED -Covering with empiric antibiotics for meningitis until Infectious disease sees patient  -No meningeal signs, but does complain of constant headache -Continue to monitor

## 2022-06-28 NOTE — Assessment & Plan Note (Signed)
-  Potassium 3.0 - K+ through the night -recheck in the AM -Continue to monitor

## 2022-06-28 NOTE — Assessment & Plan Note (Signed)
-  Continue Asa and statin

## 2022-06-28 NOTE — Assessment & Plan Note (Signed)
-  Continue HCTZ and ARB

## 2022-06-29 ENCOUNTER — Inpatient Hospital Stay (HOSPITAL_COMMUNITY)
Admit: 2022-06-29 | Discharge: 2022-06-29 | Disposition: A | Discharge status: Home / Self Care | Payer: Medicare HMO | Attending: Internal Medicine | Admitting: Internal Medicine

## 2022-06-29 ENCOUNTER — Inpatient Hospital Stay: Payer: Medicare HMO

## 2022-06-29 DIAGNOSIS — I10 Essential (primary) hypertension: Secondary | ICD-10-CM

## 2022-06-29 DIAGNOSIS — W57XXXA Bitten or stung by nonvenomous insect and other nonvenomous arthropods, initial encounter: Secondary | ICD-10-CM

## 2022-06-29 DIAGNOSIS — R7401 Elevation of levels of liver transaminase levels: Secondary | ICD-10-CM | POA: Diagnosis not present

## 2022-06-29 DIAGNOSIS — D696 Thrombocytopenia, unspecified: Secondary | ICD-10-CM | POA: Diagnosis not present

## 2022-06-29 DIAGNOSIS — R509 Fever, unspecified: Secondary | ICD-10-CM | POA: Diagnosis not present

## 2022-06-29 DIAGNOSIS — I509 Heart failure, unspecified: Secondary | ICD-10-CM | POA: Diagnosis not present

## 2022-06-29 DIAGNOSIS — R0902 Hypoxemia: Secondary | ICD-10-CM

## 2022-06-29 LAB — RESPIRATORY PANEL BY PCR

## 2022-06-29 LAB — CULTURE, BLOOD (ROUTINE X 2): Special Requests: ADEQUATE

## 2022-06-29 LAB — HEPATITIS B CORE ANTIBODY, TOTAL: Hep B Core Total Ab: NONREACTIVE

## 2022-06-29 LAB — ECHOCARDIOGRAM COMPLETE
AR max vel: 2.19 cm2
AV Area VTI: 2.5 cm2
AV Area mean vel: 2.34 cm2
AV Mean grad: 5 mmHg
AV Peak grad: 11.3 mmHg
Ao pk vel: 1.68 m/s
Area-P 1/2: 3.6 cm2
Height: 64 in
MV M vel: 4.93 m/s
MV Peak grad: 97 mmHg
MV VTI: 2.24 cm2
Radius: 0.45 cm
S' Lateral: 3.55 cm
Weight: 3344 oz

## 2022-06-29 LAB — BRAIN NATRIURETIC PEPTIDE: B Natriuretic Peptide: 827.5 pg/mL — ABNORMAL HIGH (ref 0.0–100.0)

## 2022-06-29 LAB — HEPATITIS B SURFACE ANTIGEN: Hepatitis B Surface Ag: NONREACTIVE

## 2022-06-29 MED ORDER — FUROSEMIDE 10 MG/ML IJ SOLN
40.0000 mg | Freq: Once | INTRAMUSCULAR | Status: AC
  Administered 2022-06-29: 40 mg via INTRAVENOUS
  Filled 2022-06-29: qty 4

## 2022-06-29 MED ORDER — POTASSIUM CHLORIDE CRYS ER 20 MEQ PO TBCR
40.0000 meq | EXTENDED_RELEASE_TABLET | Freq: Once | ORAL | Status: AC
  Administered 2022-06-29: 40 meq via ORAL
  Filled 2022-06-29: qty 2

## 2022-06-29 MED ORDER — IPRATROPIUM-ALBUTEROL 0.5-2.5 (3) MG/3ML IN SOLN: 3.0000 mL | Freq: Four times a day (QID) | RESPIRATORY_TRACT | Status: DC | PRN

## 2022-06-29 MED ORDER — DOXYCYCLINE HYCLATE 100 MG PO TABS
100.0000 mg | ORAL_TABLET | Freq: Two times a day (BID) | ORAL | Status: DC
  Administered 2022-06-29 – 2022-07-02 (×6): 100 mg via ORAL
  Filled 2022-06-29 (×6): qty 1

## 2022-06-29 NOTE — Progress Notes (Signed)
*  PRELIMINARY RESULTS* Echocardiogram 2D Echocardiogram has been performed.  Carolyne Fiscal 06/29/2022, 3:21 PM

## 2022-06-29 NOTE — Progress Notes (Addendum)
[facility] Infectious Diseases Follow Up Note  Patient Identification: Patient Name: Gabrielle Morris MRN: [id number] Admit Date: 06/27/2022 10:54 PM Age: 82 y.o.Today's Date: 06/29/2022  Reason for Visit: tick borne illness  Principal Problem:   Febrile illness, acute Active Problems:   PAD (peripheral artery disease) (HCC)   HTN (hypertension)   Hyponatremia   Hypokalemia   GERD (gastroesophageal reflux disease)   Elevated troponin  Antibiotics:  Ampicillin 5/1 Ceftriaxone 5/1 Vancomycin 5/1 Doxycycline    Lines/Hardware:   Interval Events: afebrile, on nasal cannula for difficulty breathing with non productive cough    Assessment # Hypoxia - likely pul edema in the setting of positive fluid balance. Less likely PNA. Chest xray 06/29/22 reviewed   # Tick bite/concerns for tick borne illness # Thrombocytopenia # Transaminitis - improving   Clinically concerning for tick borne illness like RMSF, Ehrlichia, less likely Lyme ( no travel h/o endemic regions).  Fevers, Headache, neck soreness along with rashes has resolved.   Recommendations Will change IV to PO doxycycline, complete 10 days. Fu pending serologies/respiratory viral panel   Volume management per primary to see if improvement in oxygen requirement CT chest w contrast to better evaluate hilar adenopathy/masses  Monitor for fevers, cbc, as well as signs of sepsis to re-eval for need of abtx  Dr Thedore Mins available remotely this weekend. Please call with questions   Rest of the management as per the primary team. Thank you for the consult. Please page with pertinent questions or concerns.  ______________________________________________________________________ Subjective patient seen and examined at the bedside. Niece at bedside, complains of difficulty breathing and non productive cough   Vitals BP (!) 144/87 (BP Location: Left Arm)   Pulse 85   Temp 99.2  F (37.3 C) (Axillary)   Resp 18   Ht 5\' 4"  (1.626 m)   Wt 94.8 kg   SpO2 97%   BMI 35.87 kg/m   Physical Exam Constitutional:  elderly obese female sitting in the bed and appears tired    Comments:   Cardiovascular:     Rate and Rhythm: Normal rate and regular rhythm.     Heart sounds:    Pulmonary:     Effort: Pulmonary effort is normal on 2 L nasal cannula    Comments: Basal creps  Abdominal:     Palpations: Abdomen is soft.     Tenderness: Nondistended and nontender  Musculoskeletal:        General: No swelling or tenderness in peripheral  Skin:    Comments: Rashes has resolved  Neurological:     General: Awake, alert and oriented.  Grossly nonfocal and follows commands  Psychiatric:        Mood and Affect: Mood normal.   Pertinent Microbiology Results for orders placed or performed during the hospital encounter of 06/27/22  SARS Coronavirus 2 by RT PCR (hospital order, performed in [facility] hospital lab) *cepheid single result test* Anterior Nasal Swab     Status: None   Collection Time: 06/27/22  8:07 PM   Specimen: Anterior Nasal Swab  Result Value Ref Range Status   SARS Coronavirus 2 by RT PCR NEGATIVE NEGATIVE Final    Comment: (NOTE) SARS-CoV-2 target nucleic acids are NOT DETECTED.  The SARS-CoV-2 RNA is generally detectable in upper and lower respiratory specimens during the acute phase of infection. The lowest concentration of SARS-CoV-2 viral copies this assay can detect is 250 copies / mL. A negative result does not preclude SARS-CoV-2 infection and should not  be used as the sole basis for treatment or other patient management decisions.  A negative result may occur with improper specimen collection / handling, submission of specimen other than nasopharyngeal swab, presence of viral mutation(s) within the areas targeted by this assay, and inadequate number of viral copies (<250 copies / mL). A negative result must be combined with  clinical observations, patient history, and epidemiological information.  Fact Sheet for Patients:   RoadLapTop.co.za  Fact Sheet for Healthcare Providers: http://kim-miller.com/  This test is not yet approved or  cleared by the [place] FDA and has been authorized for detection and/or diagnosis of SARS-CoV-2 by FDA under an Emergency Use Authorization (EUA).  This EUA will remain in effect (meaning this test can be used) for the duration of the COVID-19 declaration under Section 564(b)(1) of the Act, 21 U.S.C. section 360bbb-3(b)(1), unless the authorization is terminated or revoked sooner.  Performed at [facility], [address] [place], [place] [address]   Blood culture (routine x 2)     Status: None (Preliminary result)   Collection Time: 06/27/22 11:55 PM   Specimen: BLOOD  Result Value Ref Range Status   Specimen Description BLOOD LEFT [facility]  Final   Special Requests   Final    BOTTLES DRAWN AEROBIC AND ANAEROBIC Blood Culture adequate volume   Culture   Final    NO GROWTH 1 DAY Performed at [facility], [address], [place], [place] [address]    Report Status PENDING  Incomplete  Blood culture (routine x 2)     Status: None (Preliminary result)   Collection Time: 06/28/22 10:31 PM   Specimen: BLOOD LEFT ARM  Result Value Ref Range Status   Specimen Description BLOOD LEFT ARM  Final   Special Requests   Final    BOTTLES DRAWN AEROBIC AND ANAEROBIC Blood Culture results may not be optimal due to an excessive volume of blood received in culture bottles   Culture   Final    NO GROWTH < 12 HOURS Performed at [facility], [address] [place], [place] [address]    Report Status PENDING  Incomplete    Pertinent Lab.    Latest Ref Rng & Units 06/28/2022    5:59 AM 06/27/2022    8:06 PM 11/08/2017   10:31 AM  CBC  WBC 4.0 - 10.5 K/uL 9.3  8.3  9.5   Hemoglobin 12.0 - 15.0 g/dL 62.1   30.8  65.7   Hematocrit 36.0 - 46.0 % 30.8  32.8  37.4   Platelets 150 - 400 K/uL 91  94  222       Latest Ref Rng & Units 06/28/2022    5:59 AM 06/27/2022    8:06 PM 11/08/2017   10:31 AM  CMP  Glucose 70 - 99 mg/dL 846  962  952   BUN 8 - 23 mg/dL 16  14  15    Creatinine 0.44 - 1.00 mg/dL 8.41  3.24  4.01   Sodium 135 - 145 mmol/L 126  121  134   Potassium 3.5 - 5.1 mmol/L 3.4  3.0  3.2   Chloride 98 - 111 mmol/L 96  84  98   CO2 22 - 32 mmol/L 21  24  29    Calcium 8.9 - 10.3 mg/dL 7.7  8.2  9.3   Total Protein 6.5 - 8.1 g/dL 5.9  6.0    Total Bilirubin 0.3 - 1.2 mg/dL 0.8  1.2  Alkaline Phos 38 - 126 U/L 51  55    AST 15 - 41 U/L 44  46    ALT 0 - 44 U/L 30  29      Pertinent Imaging today Plain films and CT images have been personally visualized and interpreted; radiology reports have been reviewed. Decision making incorporated into the Impression   DG Chest Port 1 View  Result Date: 06/29/2022 CLINICAL DATA:  Fever, hypoxia EXAM: PORTABLE CHEST 1 VIEW COMPARISON:  Portable exam at 0829 hrs compared to 05/20/2010 FINDINGS: Upper normal heart size. Atherosclerotic calcification aorta. Pulmonary vascularity normal. Enlarged hila bilaterally. New interstitial infiltrates in the mid to lower lungs bilaterally, question edema versus infection. No pleural effusion or pneumothorax. Bones demineralized. IMPRESSION: New interstitial infiltrates of the mid to lower lungs question pulmonary edema versus infection. Enlarged pulmonary hila bilaterally concerning for hilar adenopathy/mass; CT chest with contrast recommended for further evaluation. Electronically Signed   By: Ulyses Southward M.D.   On: 06/29/2022 09:24     I have personally spent 36 minutes involved in face-to-face and non-face-to-face activities for this patient on the day of the visit. Professional time spent includes the following activities: Preparing to see the patient (review of tests), Obtaining and/or reviewing separately  obtained history (admission/discharge record), Performing a medically appropriate examination and/or evaluation , Ordering medications/tests/procedures, referring and communicating with other health care professionals, Documenting clinical information in the EMR, Independently interpreting results (not separately reported), Communicating results to the patient/family/caregiver, Counseling and educating the patient/family/caregiver and Care coordination (not separately reported).   Plan d/w requesting provider as well as ID pharm D  Note: This document was prepared using dragon voice recognition software and may include unintentional dictation errors.   Electronically signed by:   Odette Fraction, MD Infectious Disease Physician [facility] Pager: [phone]

## 2022-06-29 NOTE — Progress Notes (Signed)
PROGRESS NOTE    Gabrielle Morris  ZOX:[id number] DOB: [birth year] DOA: 06/27/2022 PCP: Lynnea Ferrier, MD    Brief Narrative:   82 year old female who presents to the ED with chief complaint of headache, nausea, diarrhea, fever, weakness.  Patient has a history of tick exposure.  Tmax at home 101.4.  Symptoms started 5 days prior to admission.  4 tick bites noted last months.  Was prescribed outpatient doxycycline and had 3 doses.  No improvement in symptoms.  Presented to the ED.   Is currently on broad-spectrum IV antibiotics.  Infectious disease consultation requested.  Recommendations greatly appreciated.  Continue broad antimicrobial coverage until formal ID evaluation.  5/3: Infectious disease consultation appreciated.  Antibiotics de-escalated to doxycycline monotherapy.  Remainder of serologies for tickborne illnesses pending at this time.   Assessment & Plan:   Principal Problem:   Febrile illness, acute Active Problems:   PAD (peripheral artery disease) (HCC)   HTN (hypertension)   Hyponatremia   Hypokalemia   GERD (gastroesophageal reflux disease)   Elevated troponin  Febrile illness Tmax at home one 1.4.  Fever Tmax 100.4 since admission.  History of 4 tick bites in the last month.  Doxycycline started as outpatient.  Patient took 3 doses and felt no better.  Presented to the ED.  Initially on broad-spectrum antibiotics.  Infectious disease consultation appreciated.  Weaned to doxycycline. Plan: Continue IV doxycycline Follow-up serologies for suspicion of tickborne illness Monitor vitals and fever curve No indication for LP  Elevated troponin Suspect supply demand ischemia in the setting of infection  GERD PPI  Hypokalemia Monitor and replace as necessary  Hyponatremia Improving.  Suspect continuation of hydrochlorothiazide in the setting of acute infection.  Continue to trend with daily BMP.  Essential hypertension Hydrochlorothiazide on hold.  ACE  inhibitor restarted  Peripheral arterial disease PTA aspirin and statin  DVT prophylaxis: SQ heparin Code Status: Full Family Communication: Niece Jasmine December at bedside 5/3 Disposition Plan: Status is: Inpatient Remains inpatient appropriate because: Febrile illness, suspect tickborne pathogen.  Workup in progress   Level of care: Telemetry Medical  Consultants:  ID  Procedures:  None  Antimicrobials: Doxycycline   Subjective: Seen and examined.  Resting comfortably in bed.  Endorses some shortness of breath this morning  Objective: Vitals:   06/29/22 0448 06/29/22 0758 06/29/22 0823 06/29/22 1144  BP: (!) 141/59 (!) 161/78  (!) 144/87  Pulse: 77 (!) 103  85  Resp: 17 18 18 18   Temp:  99.9 F (37.7 C)    TempSrc:      SpO2: 96% 98% 98% 97%  Weight:      Height:        Intake/Output Summary (Last 24 hours) at 06/29/2022 1209 Last data filed at 06/29/2022 1107 Gross per 24 hour  Intake 1841.47 ml  Output 1625 ml  Net 216.47 ml   Filed Weights   06/27/22 1949  Weight: 94.8 kg    Examination:  General exam: Appears calm and comfortable  Respiratory system: Mild crackles.  Normal work of breathing.  2 L Cardiovascular system: S1-S2, RRR, no murmurs, no pedal edema Gastrointestinal system: Soft, NT/ND, normal bowel sounds Central nervous system: Alert and oriented. No focal neurological deficits. Extremities: Symmetric 5 x 5 power. Skin: No rashes, lesions or ulcers Psychiatry: Judgement and insight appear normal. Mood & affect appropriate.     Data Reviewed: I have personally reviewed following labs and imaging studies  CBC: Recent Labs  Lab 06/27/22 2006 06/28/22  0559  WBC 8.3 9.3  NEUTROABS 6.9 7.8*  HGB 11.6* 10.7*  HCT 32.8* 30.8*  MCV 83.9 85.1  PLT 94* 91*   Basic Metabolic Panel: Recent Labs  Lab 06/27/22 2006 06/28/22 0559  NA 121* 126*  K 3.0* 3.4*  CL 84* 96*  CO2 24 21*  GLUCOSE 116* 118*  BUN 14 16  CREATININE 0.97 0.95   CALCIUM 8.2* 7.7*  MG  --  2.0   GFR: Estimated Creatinine Clearance: 51 mL/min (by C-G formula based on SCr of 0.95 mg/dL). Liver Function Tests: Recent Labs  Lab 06/27/22 2006 06/28/22 0559  AST 46* 44*  ALT 29 30  ALKPHOS 55 51  BILITOT 1.2 0.8  PROT 6.0* 5.9*  ALBUMIN 3.5 3.1*   Recent Labs  Lab 06/27/22 2006  LIPASE 30   No results for input(s): "AMMONIA" in the last 168 hours. Coagulation Profile: No results for input(s): "INR", "PROTIME" in the last 168 hours. Cardiac Enzymes: No results for input(s): "CKTOTAL", "CKMB", "CKMBINDEX", "TROPONINI" in the last 168 hours. BNP (last 3 results) No results for input(s): "PROBNP" in the last 8760 hours. HbA1C: No results for input(s): "HGBA1C" in the last 72 hours. CBG: No results for input(s): "GLUCAP" in the last 168 hours. Lipid Profile: No results for input(s): "CHOL", "HDL", "LDLCALC", "TRIG", "CHOLHDL", "LDLDIRECT" in the last 72 hours. Thyroid Function Tests: No results for input(s): "TSH", "T4TOTAL", "FREET4", "T3FREE", "THYROIDAB" in the last 72 hours. Anemia Panel: No results for input(s): "VITAMINB12", "FOLATE", "FERRITIN", "TIBC", "IRON", "RETICCTPCT" in the last 72 hours. Sepsis Labs: Recent Labs  Lab 06/28/22 0559  PROCALCITON 0.93    Recent Results (from the past 240 hour(s))  SARS Coronavirus 2 by RT PCR (hospital order, performed in [facility] hospital lab) *cepheid single result test* Anterior Nasal Swab     Status: None   Collection Time: 06/27/22  8:07 PM   Specimen: Anterior Nasal Swab  Result Value Ref Range Status   SARS Coronavirus 2 by RT PCR NEGATIVE NEGATIVE Final    Comment: (NOTE) SARS-CoV-2 target nucleic acids are NOT DETECTED.  The SARS-CoV-2 RNA is generally detectable in upper and lower respiratory specimens during the acute phase of infection. The lowest concentration of SARS-CoV-2 viral copies this assay can detect is 250 copies / mL. A negative result does not preclude  SARS-CoV-2 infection and should not be used as the sole basis for treatment or other patient management decisions.  A negative result may occur with improper specimen collection / handling, submission of specimen other than nasopharyngeal swab, presence of viral mutation(s) within the areas targeted by this assay, and inadequate number of viral copies (<250 copies / mL). A negative result must be combined with clinical observations, patient history, and epidemiological information.  Fact Sheet for Patients:   RoadLapTop.co.za  Fact Sheet for Healthcare Providers: http://kim-miller.com/  This test is not yet approved or  cleared by the [place] FDA and has been authorized for detection and/or diagnosis of SARS-CoV-2 by FDA under an Emergency Use Authorization (EUA).  This EUA will remain in effect (meaning this test can be used) for the duration of the COVID-19 declaration under Section 564(b)(1) of the Act, 21 U.S.C. section 360bbb-3(b)(1), unless the authorization is terminated or revoked sooner.  Performed at [facility], [address] [place], [place] [address]   Blood culture (routine x 2)     Status: None (Preliminary result)   Collection Time: 06/27/22 11:55 PM   Specimen: BLOOD  Result  Value Ref Range Status   Specimen Description BLOOD LEFT AC  Final   Special Requests   Final    BOTTLES DRAWN AEROBIC AND ANAEROBIC Blood Culture adequate volume   Culture   Final    NO GROWTH 1 DAY Performed at [facility], [address], [place], [place] [address]    Report Status PENDING  Incomplete  Blood culture (routine x 2)     Status: None (Preliminary result)   Collection Time: 06/28/22 10:31 PM   Specimen: BLOOD LEFT ARM  Result Value Ref Range Status   Specimen Description BLOOD LEFT ARM  Final   Special Requests   Final    BOTTLES DRAWN AEROBIC AND ANAEROBIC Blood Culture results may not be optimal  due to an excessive volume of blood received in culture bottles   Culture   Final    NO GROWTH < 12 HOURS Performed at [facility], [address] [place], [place] [address]    Report Status PENDING  Incomplete         Radiology Studies: DG Chest Port 1 View  Result Date: 06/29/2022 CLINICAL DATA:  Fever, hypoxia EXAM: PORTABLE CHEST 1 VIEW COMPARISON:  Portable exam at 0829 hrs compared to 05/20/2010 FINDINGS: Upper normal heart size. Atherosclerotic calcification aorta. Pulmonary vascularity normal. Enlarged hila bilaterally. New interstitial infiltrates in the mid to lower lungs bilaterally, question edema versus infection. No pleural effusion or pneumothorax. Bones demineralized. IMPRESSION: New interstitial infiltrates of the mid to lower lungs question pulmonary edema versus infection. Enlarged pulmonary hila bilaterally concerning for hilar adenopathy/mass; CT chest with contrast recommended for further evaluation. Electronically Signed   By: Ulyses Southward M.D.   On: 06/29/2022 09:24   CT Head Wo Contrast  Result Date: 06/27/2022 CLINICAL DATA:  Headache EXAM: CT HEAD WITHOUT CONTRAST TECHNIQUE: Contiguous axial images were obtained from the base of the skull through the vertex without intravenous contrast. RADIATION DOSE REDUCTION: This exam was performed according to the departmental dose-optimization program which includes automated exposure control, adjustment of the mA and/or kV according to patient size and/or use of iterative reconstruction technique. COMPARISON:  MRI head 05/08/2020 FINDINGS: Brain: No evidence of acute infarction, hemorrhage, hydrocephalus, extra-axial collection or mass lesion/mass effect. Vascular: Atherosclerotic calcifications are present within the cavernous internal carotid arteries. Skull: Normal. Negative for fracture or focal lesion. Sinuses/Orbits: No acute finding. Other: None. IMPRESSION: No acute intracranial abnormality. Electronically Signed    By: Darliss Cheney M.D.   On: 06/27/2022 20:49        Scheduled Meds:  aspirin EC  81 mg Oral Daily   atorvastatin  40 mg Oral QHS   heparin  5,000 Units Subcutaneous Q8H   losartan  50 mg Oral BID   pantoprazole  40 mg Oral Daily   Continuous Infusions:  doxycycline (VIBRAMYCIN) IV 100 mg (06/29/22 0810)     LOS: 1 day     Tresa Moore, MD [facility]   If 7PM-7AM, please contact night-coverage  06/29/2022, 12:09 PM

## 2022-06-30 DIAGNOSIS — R509 Fever, unspecified: Secondary | ICD-10-CM | POA: Diagnosis not present

## 2022-06-30 LAB — CBC WITH DIFFERENTIAL/PLATELET
Abs Immature Granulocytes: 0.34 10*3/uL — ABNORMAL HIGH (ref 0.00–0.07)
Basophils Absolute: 0.1 10*3/uL (ref 0.0–0.1)
Basophils Relative: 1 %
Eosinophils Absolute: 0 10*3/uL (ref 0.0–0.5)
Eosinophils Relative: 0 %
HCT: 28.8 % — ABNORMAL LOW (ref 36.0–46.0)
Hemoglobin: 10.1 g/dL — ABNORMAL LOW (ref 12.0–15.0)
Immature Granulocytes: 3 %
Lymphocytes Relative: 20 %
Lymphs Abs: 2 10*3/uL (ref 0.7–4.0)
MCH: 29.4 pg (ref 26.0–34.0)
MCHC: 35.1 g/dL (ref 30.0–36.0)
MCV: 84 fL (ref 80.0–100.0)
Monocytes Absolute: 0.7 10*3/uL (ref 0.1–1.0)
Monocytes Relative: 7 %
Neutro Abs: 7.1 10*3/uL (ref 1.7–7.7)
Neutrophils Relative %: 69 %
Platelets: 117 10*3/uL — ABNORMAL LOW (ref 150–400)
RBC: 3.43 MIL/uL — ABNORMAL LOW (ref 3.87–5.11)
RDW: 12.4 % (ref 11.5–15.5)
WBC: 10.2 10*3/uL (ref 4.0–10.5)
nRBC: 0 % (ref 0.0–0.2)

## 2022-06-30 LAB — HCV INTERPRETATION

## 2022-06-30 LAB — HEPATITIS B SURFACE ANTIBODY, QUANTITATIVE: Hep B S AB Quant (Post): 9.6 m[IU]/mL — ABNORMAL LOW (ref >9.9)

## 2022-06-30 LAB — BASIC METABOLIC PANEL
Anion gap: 8 (ref 5–15)
BUN: 15 mg/dL (ref 8–23)
CO2: 24 mmol/L (ref 22–32)
Calcium: 7.9 mg/dL — ABNORMAL LOW (ref 8.9–10.3)
Chloride: 97 mmol/L — ABNORMAL LOW (ref 98–111)
Creatinine, Ser: 0.91 mg/dL (ref 0.44–1.00)
GFR, Estimated: >60 mL/min (ref >60)
Glucose, Bld: 101 mg/dL — ABNORMAL HIGH (ref 70–99)
Potassium: 2.8 mmol/L — ABNORMAL LOW (ref 3.5–5.1)
Sodium: 129 mmol/L — ABNORMAL LOW (ref 135–145)

## 2022-06-30 LAB — SPOTTED FEVER GROUP ANTIBODIES
Spotted Fever Group IgG: <1:64 {titer}
Spotted Fever Group IgM: <1:64 {titer}

## 2022-06-30 LAB — HCV AB W REFLEX TO QUANT PCR: HCV Ab: NONREACTIVE

## 2022-06-30 LAB — CULTURE, BLOOD (ROUTINE X 2)

## 2022-06-30 MED ORDER — METHYLPREDNISOLONE SODIUM SUCC 40 MG IJ SOLR
40.0000 mg | Freq: Every day | INTRAMUSCULAR | Status: DC
  Administered 2022-06-30 – 2022-07-01 (×2): 40 mg via INTRAVENOUS
  Filled 2022-06-30 (×2): qty 1

## 2022-06-30 MED ORDER — FUROSEMIDE 10 MG/ML IJ SOLN
20.0000 mg | Freq: Once | INTRAMUSCULAR | Status: AC
  Administered 2022-06-30: 20 mg via INTRAVENOUS
  Filled 2022-06-30: qty 4

## 2022-06-30 MED ORDER — POTASSIUM CHLORIDE CRYS ER 20 MEQ PO TBCR
40.0000 meq | EXTENDED_RELEASE_TABLET | ORAL | Status: AC
  Administered 2022-06-30 (×3): 40 meq via ORAL
  Filled 2022-06-30 (×2): qty 2

## 2022-06-30 NOTE — Progress Notes (Signed)
PROGRESS NOTE    Gabrielle Morris  EAV:[id number] DOB: 05/15/1940 DOA: 06/27/2022 PCP: Lynnea Ferrier, MD    Brief Narrative:   82 year old female who presents to the ED with chief complaint of headache, nausea, diarrhea, fever, weakness.  Patient has a history of tick exposure.  Tmax at home 101.4.  Symptoms started 5 days prior to admission.  4 tick bites noted last months.  Was prescribed outpatient doxycycline and had 3 doses.  No improvement in symptoms.  Presented to the ED.   Is currently on broad-spectrum IV antibiotics.  Infectious disease consultation requested.  Recommendations greatly appreciated.  Continue broad antimicrobial coverage until formal ID evaluation.  5/3: Infectious disease consultation appreciated.  Antibiotics de-escalated to doxycycline monotherapy.  Remainder of serologies for tickborne illnesses pending at this time.  5/4: Patient doing well overall.  RVP positive for parainfluenza virus.  Echocardiogram reassuring.   Assessment & Plan:   Principal Problem:   Febrile illness, acute Active Problems:   PAD (peripheral artery disease) (HCC)   HTN (hypertension)   Hyponatremia   Hypokalemia   GERD (gastroesophageal reflux disease)   Elevated troponin  Febrile illness Tmax at home 101.4  Fever Tmax 100.4 since admission.  History of 4 tick bites in the last month.  Has been on doxycycline.  Recommend spotted fever negative, HCV negative.  Ehrlichia and Lyme pending.  Switch to p.o. doxycycline 5/3 Plan: Continue p.o. doxycycline Follow-up remainder of serologies including Lyme and Ehrlichia Monitor vitals and fever curve  Parainfluenza virus Viral syndrome.  May be driving some of the febrile illness as above.  Unclear whether fever and current presentation is more related to respiratory viral syndrome versus possible tickborne illness. Plan: Solu-Medrol 40 mg IV Monitor vitals and fever curve  Elevated troponin Suspect supply demand  ischemia in the setting of infection  GERD PPI  Hypokalemia Monitor and replace as necessary  Hyponatremia Improving.  Suspect continuation of hydrochlorothiazide in the setting of acute infection.  Continue to trend with daily BMP.  Essential hypertension Hydrochlorothiazide on hold.  ACE inhibitor restarted  Peripheral arterial disease PTA aspirin and statin  DVT prophylaxis: SQ heparin Code Status: Full Family Communication: Niece Jasmine December at bedside 5/3, 5/4 Disposition Plan: Status is: Inpatient Remains inpatient appropriate because: Febrile illness, suspect tickborne pathogen.  Workup in progress   Level of care: Telemetry Medical  Consultants:  ID  Procedures:  None  Antimicrobials: Doxycycline   Subjective: Seen and examined.  Resting comfortably in bed.  Shortness of breath improving.  Objective: Vitals:   06/29/22 2001 06/29/22 2227 06/30/22 0419 06/30/22 0953  BP: (!) 140/68  (!) 122/54 126/60  Pulse: 80  85 84  Resp: 18  18 20   Temp: 98.3 F (36.8 C)  98.3 F (36.8 C) 98.2 F (36.8 C)  TempSrc: Oral  Oral Oral  SpO2: 99% 99% 94% 95%  Weight:      Height:        Intake/Output Summary (Last 24 hours) at 06/30/2022 1318 Last data filed at 06/29/2022 2227 Gross per 24 hour  Intake 266.02 ml  Output --  Net 266.02 ml   Filed Weights   06/27/22 1949  Weight: 94.8 kg    Examination:  General exam: NAD Respiratory system: Bibasilar crackles.  Normal work of breathing.  Room air Cardiovascular system: S1-S2, RRR, no murmurs, no pedal edema Gastrointestinal system: Soft, NT/ND, normal bowel sounds Central nervous system: Alert and oriented. No focal neurological deficits. Extremities: Symmetric 5 x  5 power. Skin: No rashes, lesions or ulcers Psychiatry: Judgement and insight appear normal. Mood & affect appropriate.     Data Reviewed: I have personally reviewed following labs and imaging studies  CBC: Recent Labs  Lab 06/27/22 2006  06/28/22 0559 06/30/22 0536  WBC 8.3 9.3 10.2  NEUTROABS 6.9 7.8* 7.1  HGB 11.6* 10.7* 10.1*  HCT 32.8* 30.8* 28.8*  MCV 83.9 85.1 84.0  PLT 94* 91* 117*   Basic Metabolic Panel: Recent Labs  Lab 06/27/22 2006 06/28/22 0559 06/30/22 0536  NA 121* 126* 129*  K 3.0* 3.4* 2.8*  CL 84* 96* 97*  CO2 24 21* 24  GLUCOSE 116* 118* 101*  BUN 14 16 15   CREATININE 0.97 0.95 0.91  CALCIUM 8.2* 7.7* 7.9*  MG  --  2.0  --    GFR: Estimated Creatinine Clearance: 53.2 mL/min (by C-G formula based on SCr of 0.91 mg/dL). Liver Function Tests: Recent Labs  Lab 06/27/22 2006 06/28/22 0559  AST 46* 44*  ALT 29 30  ALKPHOS 55 51  BILITOT 1.2 0.8  PROT 6.0* 5.9*  ALBUMIN 3.5 3.1*   Recent Labs  Lab 06/27/22 2006  LIPASE 30   No results for input(s): "AMMONIA" in the last 168 hours. Coagulation Profile: No results for input(s): "INR", "PROTIME" in the last 168 hours. Cardiac Enzymes: No results for input(s): "CKTOTAL", "CKMB", "CKMBINDEX", "TROPONINI" in the last 168 hours. BNP (last 3 results) No results for input(s): "PROBNP" in the last 8760 hours. HbA1C: No results for input(s): "HGBA1C" in the last 72 hours. CBG: No results for input(s): "GLUCAP" in the last 168 hours. Lipid Profile: No results for input(s): "CHOL", "HDL", "LDLCALC", "TRIG", "CHOLHDL", "LDLDIRECT" in the last 72 hours. Thyroid Function Tests: No results for input(s): "TSH", "T4TOTAL", "FREET4", "T3FREE", "THYROIDAB" in the last 72 hours. Anemia Panel: No results for input(s): "VITAMINB12", "FOLATE", "FERRITIN", "TIBC", "IRON", "RETICCTPCT" in the last 72 hours. Sepsis Labs: Recent Labs  Lab 06/28/22 0559  PROCALCITON 0.93    Recent Results (from the past 240 hour(s))  SARS Coronavirus 2 by RT PCR (hospital order, performed in [facility] hospital lab) *cepheid single result test* Anterior Nasal Swab     Status: None   Collection Time: 06/27/22  8:07 PM   Specimen: Anterior Nasal Swab  Result  Value Ref Range Status   SARS Coronavirus 2 by RT PCR NEGATIVE NEGATIVE Final    Comment: (NOTE) SARS-CoV-2 target nucleic acids are NOT DETECTED.  The SARS-CoV-2 RNA is generally detectable in upper and lower respiratory specimens during the acute phase of infection. The lowest concentration of SARS-CoV-2 viral copies this assay can detect is 250 copies / mL. A negative result does not preclude SARS-CoV-2 infection and should not be used as the sole basis for treatment or other patient management decisions.  A negative result may occur with improper specimen collection / handling, submission of specimen other than nasopharyngeal swab, presence of viral mutation(s) within the areas targeted by this assay, and inadequate number of viral copies (<250 copies / mL). A negative result must be combined with clinical observations, patient history, and epidemiological information.  Fact Sheet for Patients:   RoadLapTop.co.za  Fact Sheet for Healthcare Providers: http://kim-miller.com/  This test is not yet approved or  cleared by the [place] FDA and has been authorized for detection and/or diagnosis of SARS-CoV-2 by FDA under an Emergency Use Authorization (EUA).  This EUA will remain in effect (meaning this test can be used) for the duration  of the COVID-19 declaration under Section 564(b)(1) of the Act, 21 U.S.C. section 360bbb-3(b)(1), unless the authorization is terminated or revoked sooner.  Performed at [facility], [address] [place], [place] [address]   Blood culture (routine x 2)     Status: None (Preliminary result)   Collection Time: 06/27/22 11:55 PM   Specimen: BLOOD  Result Value Ref Range Status   Specimen Description BLOOD LEFT [facility]  Final   Special Requests   Final    BOTTLES DRAWN AEROBIC AND ANAEROBIC Blood Culture adequate volume   Culture   Final    NO GROWTH 2 DAYS Performed at [facility], [address] [place], [place] [address]    Report Status PENDING  Incomplete  Blood culture (routine x 2)     Status: None (Preliminary result)   Collection Time: 06/28/22 10:31 PM   Specimen: BLOOD LEFT ARM  Result Value Ref Range Status   Specimen Description BLOOD LEFT ARM  Final   Special Requests   Final    BOTTLES DRAWN AEROBIC AND ANAEROBIC Blood Culture results may not be optimal due to an excessive volume of blood received in culture bottles   Culture   Final    NO GROWTH 2 DAYS Performed at [facility], [address] [place], [place] [address]    Report Status PENDING  Incomplete  Respiratory (~20 pathogens) panel by PCR     Status: Abnormal   Collection Time: 06/29/22  8:14 AM   Specimen: Nasopharyngeal Swab; Respiratory  Result Value Ref Range Status   Adenovirus NOT DETECTED NOT DETECTED Final   Coronavirus 229E NOT DETECTED NOT DETECTED Final    Comment: (NOTE) The Coronavirus on the Respiratory Panel, DOES NOT test for the novel  Coronavirus (2019 nCoV)    Coronavirus HKU1 NOT DETECTED NOT DETECTED Final   Coronavirus NL63 NOT DETECTED NOT DETECTED Final   Coronavirus OC43 NOT DETECTED NOT DETECTED Final   Metapneumovirus NOT DETECTED NOT DETECTED Final   Rhinovirus / Enterovirus NOT DETECTED NOT DETECTED Final   Influenza A NOT DETECTED NOT DETECTED Final   Influenza B NOT DETECTED NOT DETECTED Final   Parainfluenza Virus 1 NOT DETECTED NOT DETECTED Final   Parainfluenza Virus 2 NOT DETECTED NOT DETECTED Final   Parainfluenza Virus 3 DETECTED (A) NOT DETECTED Final   Parainfluenza Virus 4 NOT DETECTED NOT DETECTED Final   Respiratory Syncytial Virus NOT DETECTED NOT DETECTED Final   Bordetella pertussis NOT DETECTED NOT DETECTED Final   Bordetella Parapertussis NOT DETECTED NOT DETECTED Final   Chlamydophila pneumoniae NOT DETECTED NOT DETECTED Final   Mycoplasma pneumoniae NOT DETECTED NOT DETECTED Final    Comment: Performed at [facility], [address] [place], [place] [address]         Radiology Studies: ECHOCARDIOGRAM COMPLETE  Result Date: 06/29/2022    ECHOCARDIOGRAM REPORT   Patient Name:   LILYONA SPIGHT [facility] Date of Exam: 06/29/2022 Medical Rec #:  [id number]             Height:       64.0 in Accession #:    [id number]            Weight:       209.0 lb Date of Birth:  02-18-1941             BSA:          1.993 m Patient Age:    68 years  BP:           144/87 mmHg Patient Gender: F                     HR:           68 bpm. Exam Location:  [facility] Procedure: 2D Echo, Cardiac Doppler, Color Doppler and Strain Analysis Indications:     CHF  History:         Patient has no prior history of Echocardiogram examinations.                  CHF, PAD, Arrythmias:PVC, Signs/Symptoms:Chest Pain; Risk                  Factors:Hypertension.  Sonographer:     Mikki Harbor Referring Phys:  [id number] Tresa Moore Diagnosing Phys: Julien Nordmann MD  Sonographer Comments: Global longitudinal strain was attempted. IMPRESSIONS  1. Left ventricular ejection fraction, by estimation, is 55 to 60%. The left ventricle has normal function. The left ventricle has no regional wall motion abnormalities. Left ventricular diastolic parameters are indeterminate. The average left ventricular global longitudinal strain is -14.1 %.  2. Right ventricular systolic function is normal. The right ventricular size is normal. There is normal pulmonary artery systolic pressure.  3. Left atrial size was moderately dilated.  4. The mitral valve is normal in structure. Moderate mitral valve regurgitation. No evidence of mitral stenosis.  5. Tricuspid valve regurgitation is moderate.  6. The aortic valve is normal in structure. Aortic valve regurgitation is not visualized. No aortic stenosis is present.  7. The inferior vena cava is normal in size with greater than 50% respiratory variability, suggesting right atrial pressure of 3 mmHg. FINDINGS  Left  Ventricle: Left ventricular ejection fraction, by estimation, is 55 to 60%. The left ventricle has normal function. The left ventricle has no regional wall motion abnormalities. The average left ventricular global longitudinal strain is -14.1 %. The left ventricular internal cavity size was normal in size. There is no left ventricular hypertrophy. Left ventricular diastolic parameters are indeterminate. Right Ventricle: The right ventricular size is normal. No increase in right ventricular wall thickness. Right ventricular systolic function is normal. There is normal pulmonary artery systolic pressure. The tricuspid regurgitant velocity is 2.63 m/s, and  with an assumed right atrial pressure of 3 mmHg, the estimated right ventricular systolic pressure is 30.7 mmHg. Left Atrium: Left atrial size was moderately dilated. Right Atrium: Right atrial size was normal in size. Pericardium: There is no evidence of pericardial effusion. Mitral Valve: The mitral valve is normal in structure. Mild mitral annular calcification. Moderate mitral valve regurgitation. No evidence of mitral valve stenosis. MV peak gradient, 5.4 mmHg. The mean mitral valve gradient is 2.0 mmHg. Tricuspid Valve: The tricuspid valve is normal in structure. Tricuspid valve regurgitation is moderate . No evidence of tricuspid stenosis. Aortic Valve: The aortic valve is normal in structure. Aortic valve regurgitation is not visualized. No aortic stenosis is present. Aortic valve mean gradient measures 5.0 mmHg. Aortic valve peak gradient measures 11.3 mmHg. Aortic valve area, by VTI measures 2.50 cm. Pulmonic Valve: The pulmonic valve was normal in structure. Pulmonic valve regurgitation is not visualized. No evidence of pulmonic stenosis. Aorta: The aortic root is normal in size and structure. Venous: The inferior vena cava is normal in size with greater than 50% respiratory variability, suggesting right atrial pressure of 3 mmHg. IAS/Shunts: No atrial  level shunt detected by color flow Doppler.  LEFT VENTRICLE PLAX 2D LVIDd:         5.00 cm   Diastology LVIDs:         3.55 cm   LV e' medial:    10.00 cm/s LV PW:         1.20 cm   LV E/e' medial:  11.4 LV IVS:        1.00 cm   LV e' lateral:   13.60 cm/s LVOT diam:     2.00 cm   LV E/e' lateral: 8.4 LV SV:         91 LV SV Index:   46        2D Longitudinal Strain LVOT Area:     3.14 cm  2D Strain GLS Avg:     -14.1 %  RIGHT VENTRICLE RV Basal diam:  4.30 cm RV Mid diam:    3.20 cm RV S prime:     15.20 cm/s TAPSE (M-mode): 2.7 cm LEFT ATRIUM             Index        RIGHT ATRIUM           Index LA diam:        4.80 cm 2.41 cm/m   RA Area:     21.70 cm LA Vol (A2C):   91.9 ml 46.10 ml/m  RA Volume:   69.90 ml  35.07 ml/m LA Vol (A4C):   63.0 ml 31.61 ml/m LA Biplane Vol: 75.2 ml 37.73 ml/m  AORTIC VALVE                     PULMONIC VALVE AV Area (Vmax):    2.19 cm      PV Vmax:       1.09 m/s AV Area (Vmean):   2.34 cm      PV Peak grad:  4.8 mmHg AV Area (VTI):     2.50 cm AV Vmax:           168.00 cm/s AV Vmean:          108.000 cm/s AV VTI:            0.363 m AV Peak Grad:      11.3 mmHg AV Mean Grad:      5.0 mmHg LVOT Vmax:         117.00 cm/s LVOT Vmean:        80.600 cm/s LVOT VTI:          0.289 m LVOT/AV VTI ratio: 0.80  AORTA Ao Root diam: 3.10 cm MITRAL VALVE                  TRICUSPID VALVE MV Area (PHT): 3.60 cm       TR Peak grad:   27.7 mmHg MV Area VTI:   2.24 cm       TR Vmax:        263.00 cm/s MV Peak grad:  5.4 mmHg MV Mean grad:  2.0 mmHg       SHUNTS MV Vmax:       1.16 m/s       Systemic VTI:  0.29 m MV Vmean:      61.0 cm/s      Systemic Diam: 2.00 cm MV Decel Time: 211 msec MR Peak grad:    97.0 mmHg MR Mean grad:    64.5 mmHg MR Vmax:         492.50 cm/s MR  Vmean:        370.0 cm/s MR PISA:         1.27 cm MR PISA Eff ROA: 24 mm MR PISA Radius:  0.45 cm MV E velocity: 114.00 cm/s MV A velocity: 91.20 cm/s MV E/A ratio:  1.25 Julien Nordmann MD Electronically signed by Julien Nordmann MD Signature Date/Time: 06/29/2022/4:34:25 PM    Final    DG Chest Port 1 View  Result Date: 06/29/2022 CLINICAL DATA:  Fever, hypoxia EXAM: PORTABLE CHEST 1 VIEW COMPARISON:  Portable exam at 0829 hrs compared to 05/20/2010 FINDINGS: Upper normal heart size. Atherosclerotic calcification aorta. Pulmonary vascularity normal. Enlarged hila bilaterally. New interstitial infiltrates in the mid to lower lungs bilaterally, question edema versus infection. No pleural effusion or pneumothorax. Bones demineralized. IMPRESSION: New interstitial infiltrates of the mid to lower lungs question pulmonary edema versus infection. Enlarged pulmonary hila bilaterally concerning for hilar adenopathy/mass; CT chest with contrast recommended for further evaluation. Electronically Signed   By: Ulyses Southward M.D.   On: 06/29/2022 09:24        Scheduled Meds:  aspirin EC  81 mg Oral Daily   atorvastatin  40 mg Oral QHS   doxycycline  100 mg Oral Q12H   heparin  5,000 Units Subcutaneous Q8H   losartan  50 mg Oral BID   methylPREDNISolone (SOLU-MEDROL) injection  40 mg Intravenous Daily   pantoprazole  40 mg Oral Daily   potassium chloride  40 mEq Oral Q2H   Continuous Infusions:     LOS: 2 days     Tresa Moore, MD [facility]   If 7PM-7AM, please contact night-coverage  06/30/2022, 1:18 PM

## 2022-07-01 DIAGNOSIS — R509 Fever, unspecified: Secondary | ICD-10-CM | POA: Diagnosis not present

## 2022-07-01 LAB — CBC WITH DIFFERENTIAL/PLATELET
Abs Immature Granulocytes: 0.88 10*3/uL — ABNORMAL HIGH (ref 0.00–0.07)
Basophils Absolute: 0.1 10*3/uL (ref 0.0–0.1)
Basophils Relative: 1 %
Eosinophils Absolute: 0 10*3/uL (ref 0.0–0.5)
Eosinophils Relative: 0 %
HCT: 32.8 % — ABNORMAL LOW (ref 36.0–46.0)
Hemoglobin: 11.5 g/dL — ABNORMAL LOW (ref 12.0–15.0)
Immature Granulocytes: 6 %
Lymphocytes Relative: 20 %
Lymphs Abs: 3 10*3/uL (ref 0.7–4.0)
MCH: 29.6 pg (ref 26.0–34.0)
MCHC: 35.1 g/dL (ref 30.0–36.0)
MCV: 84.5 fL (ref 80.0–100.0)
Monocytes Absolute: 1 10*3/uL (ref 0.1–1.0)
Monocytes Relative: 6 %
Neutro Abs: 10.5 10*3/uL — ABNORMAL HIGH (ref 1.7–7.7)
Neutrophils Relative %: 67 %
Platelets: 186 10*3/uL (ref 150–400)
RBC: 3.88 MIL/uL (ref 3.87–5.11)
RDW: 12.8 % (ref 11.5–15.5)
Smear Review: NORMAL
WBC: 15.4 10*3/uL — ABNORMAL HIGH (ref 4.0–10.5)
nRBC: 0 % (ref 0.0–0.2)

## 2022-07-01 LAB — BASIC METABOLIC PANEL
Anion gap: 10 (ref 5–15)
BUN: 17 mg/dL (ref 8–23)
CO2: 23 mmol/L (ref 22–32)
Calcium: 8.7 mg/dL — ABNORMAL LOW (ref 8.9–10.3)
Chloride: 99 mmol/L (ref 98–111)
Creatinine, Ser: 0.81 mg/dL (ref 0.44–1.00)
GFR, Estimated: >60 mL/min (ref >60)
Glucose, Bld: 122 mg/dL — ABNORMAL HIGH (ref 70–99)
Potassium: 4 mmol/L (ref 3.5–5.1)
Sodium: 132 mmol/L — ABNORMAL LOW (ref 135–145)

## 2022-07-01 LAB — CULTURE, BLOOD (ROUTINE X 2)

## 2022-07-01 MED ORDER — FUROSEMIDE 10 MG/ML IJ SOLN
20.0000 mg | Freq: Once | INTRAMUSCULAR | Status: AC
  Administered 2022-07-01: 20 mg via INTRAVENOUS
  Filled 2022-07-01: qty 4

## 2022-07-01 NOTE — Evaluation (Addendum)
Physical Therapy Evaluation Patient Details Name: Gabrielle Morris MRN: [id number] DOB: 1940/12/31 Today's Date: 07/01/2022  History of Present Illness  82 year old female who presents to the ED with chief complaint of headache, nausea, diarrhea, fever, weakness.  Patient has a history of tick exposure.  Tmax at home 101.4.  Symptoms started 5 days prior to admission.  4 tick bites noted last months.  Was prescribed outpatient doxycycline and had 3 doses.  No improvement in symptoms.  Presented to the ED. Positive for parainfluenza virus 3. Serologies for tickborne illnesses pending.   Clinical Impression  Patient received sitting in recliner and is agreeable to PT evaluation. Patient is alert and oriented and able to provide detailed history. 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 Prior to hospitalization, patient was I with all aspects of care and mobility and took care of yard work, housework, and her sister's needs. She has nieces and nephews who are currently helping with her sister and are available to stay with patient until she is back to prior level of function. Upon PT evaluation, patient was mod I with bed mobility and transfers but needed supervision for ambulation with RW. She attempted ambulation without RW but was reaching for objects to stabilize her in the room. PT recommends she use a RW for mobility until she feels more stable on her feet. Patient would benefit from skilled physical therapy to address impairments and functional limitations (see PT Problem List below) to work towards stated goals and return to PLOF or maximal functional independence.         Recommendations for follow up therapy are one component of a multi-disciplinary discharge planning process, led by the attending physician.  Recommendations may be updated based on patient status, additional functional criteria and insurance  authorization.  Follow Up Recommendations       Assistance Recommended at Discharge Intermittent Supervision/Assistance  Patient can return home with the following  A little help with walking and/or transfers;A little help with bathing/dressing/bathroom;Assist for transportation;Help with stairs or ramp for entrance;Assistance with cooking/housework    Equipment Recommendations Rolling walker (2 wheels)  Recommendations for Other Services       Functional Status Assessment Patient has had a recent decline in their functional status and demonstrates the ability to make significant improvements in function in a reasonable and predictable amount of time.     Precautions / Restrictions Precautions Precautions: Fall Precaution Comments: droplet precautions Restrictions Weight Bearing Restrictions: No      Mobility  Bed Mobility Overal bed mobility: Independent                  Transfers Overall transfer level: Modified independent Equipment used: None               General transfer comment: Patient completed chair, bed, and standard commode transfers mod I with B UE support on surfaces and nearby objects.    Ambulation/Gait Ambulation/Gait assistance: Supervision Gait Distance (Feet): 200 Feet Assistive device: Rolling walker (2 wheels) Gait Pattern/deviations: WFL(Within Functional Limits) Gait velocity: decreased     General Gait Details: patient ambulated around nursing station with RW and supervision.  Stairs            Wheelchair Mobility    Modified Rankin (Stroke Patients Only)       Balance Overall balance assessment: Needs assistance   Sitting balance-Leahy Scale: Normal       Standing  balance-Leahy Scale: Good Standing balance comment: Patient reaching for and holding on to objects around room while ambulating with no RW. Ambulates without LOB with RW. Has good safety awareness and is able to verbalize how to position body and  walker properly.                             Pertinent Vitals/Pain Pain Assessment Pain Assessment: No/denies pain    Home Living Family/patient expects to be discharged to:: Private residence Living Arrangements: Other relatives (patient takes care of her sister.) Available Help at Discharge: Family;Available 24 hours/day (neices and nephews helping her sister now) Type of Home: House Home Access: Level entry (handicapped accessable)       Home Layout: One level Home Equipment: Cane - quad;Shower seat;Grab bars - tub/shower;Grab bars - toilet;Hand held shower head Additional Comments: handicapped accessable home    Prior Function Prior Level of Function : Independent/Modified Independent;Driving;History of Falls (last six months)               ADLs Comments: Patien fully A with all aspects of care and mobility. She is the primary caregiver for her sister. Patient cares for her sister who is able to walk but has dementia, so she needs help with ADLs and IADLs. Patient reports one fall where she slid on the floor after a bath when she got too weak while currently sick which brought her to the hospital.     Hand Dominance   Dominant Hand: Left    Extremity/Trunk Assessment   Upper Extremity Assessment Upper Extremity Assessment: Overall WFL for tasks assessed    Lower Extremity Assessment Lower Extremity Assessment: Overall WFL for tasks assessed    Cervical / Trunk Assessment Cervical / Trunk Assessment: Kyphotic  Communication   Communication: No difficulties  Cognition Arousal/Alertness: Awake/alert Behavior During Therapy: WFL for tasks assessed/performed Overall Cognitive Status: Within Functional Limits for tasks assessed                                          General Comments General comments (skin integrity, edema, etc.): vitals [facility]    Exercises Other Exercises Other Exercises: Educated patient about role of PT in acute  care setting and discharge reccomendations. Educated patient on how to use a rollator walker.   Assessment/Plan    PT Assessment Patient needs continued PT services  PT Problem List Decreased strength;Decreased balance;Decreased mobility;Decreased activity tolerance       PT Treatment Interventions DME instruction;Balance training;Patient/family education;Functional mobility training;Gait training;Therapeutic activities;Neuromuscular re-education;Therapeutic exercise    PT Goals (Current goals can be found in the Care Plan section)  Acute Rehab PT Goals Patient Stated Goal: to go home PT Goal Formulation: With patient Time For Goal Achievement: 07/15/22 Potential to Achieve Goals: Good    Frequency Min 3X/week     Co-evaluation               AM-PAC PT "6 Clicks" Mobility  Outcome Measure Help needed turning from your back to your side while in a flat bed without using bedrails?: None Help needed moving from lying on your back to sitting on the side of a flat bed without using bedrails?: None Help needed moving to and from a bed to a chair (including a wheelchair)?: None Help needed standing up from a chair using your arms (e.g.,  wheelchair or bedside chair)?: None Help needed to walk in hospital room?: A Little Help needed climbing 3-5 steps with a railing? : A Little 6 Click Score: 22    End of Session Equipment Utilized During Treatment: Gait belt Activity Tolerance: Patient tolerated treatment well;No increased pain Patient left: in chair;with call bell/phone within reach Nurse Communication: Mobility status PT Visit Diagnosis: Unsteadiness on feet (R26.81)    Time: 1610-9604 PT Time Calculation (min) (ACUTE ONLY): 19 min   Charges:   PT Evaluation $PT Eval Low Complexity: 1 Low          Maddalena Linarez R. Ilsa Iha, PT, DPT 07/01/22, 2:08 PM

## 2022-07-01 NOTE — Progress Notes (Signed)
PROGRESS NOTE    Gabrielle Morris  ZOX:[id number] DOB: [birth year] DOA: 06/27/2022 PCP: Lynnea Ferrier, MD    Brief Narrative:   82 year old female who presents to the ED with chief complaint of headache, nausea, diarrhea, fever, weakness.  Patient has a history of tick exposure.  Tmax at home 101.4.  Symptoms started 5 days prior to admission.  4 tick bites noted last months.  Was prescribed outpatient doxycycline and had 3 doses.  No improvement in symptoms.  Presented to the ED.   Is currently on broad-spectrum IV antibiotics.  Infectious disease consultation requested.  Recommendations greatly appreciated.  Continue broad antimicrobial coverage until formal ID evaluation.  5/3: Infectious disease consultation appreciated.  Antibiotics de-escalated to doxycycline monotherapy.  Remainder of serologies for tickborne illnesses pending at this time.  5/4: Patient doing well overall.  RVP positive for parainfluenza virus.  Echocardiogram reassuring.   Assessment & Plan:   Principal Problem:   Febrile illness, acute Active Problems:   PAD (peripheral artery disease) (HCC)   HTN (hypertension)   Hyponatremia   Hypokalemia   GERD (gastroesophageal reflux disease)   Elevated troponin  Febrile illness Tmax at home 101.4  Fever Tmax 100.4 since admission.  History of 4 tick bites in the last month.  Has been on doxycycline.  [facility] spotted fever negative, HCV negative.  Ehrlichia and Lyme pending.  Switch to p.o. doxycycline 5/3 Plan: Continue p.o. doxycycline Follow-up remainder of serologies including Lyme and Ehrlichia Monitor vitals and fever curve  Parainfluenza virus Viral syndrome.  May be driving some of the febrile illness as above.  Unclear whether fever and current presentation is more related to respiratory viral syndrome versus possible tickborne illness. Plan: Solu-Medrol 40 mg IV Lasix 20 mg IV x 1 Monitor vitals and fever curve  Elevated  troponin Suspect supply demand ischemia in the setting of infection  GERD PPI  Hypokalemia Monitor and replace as necessary  Hyponatremia Improving.  Suspect continuation of hydrochlorothiazide in the setting of acute infection.  Continue to trend with daily BMP.  Essential hypertension Hydrochlorothiazide on hold.  ACE inhibitor restarted  Peripheral arterial disease PTA aspirin and statin  DVT prophylaxis: SQ heparin Code Status: Full Family Communication: Niece Jasmine December at bedside 5/3, 5/4, 5/5 Disposition Plan: Status is: Inpatient Remains inpatient appropriate because: Febrile illness, suspect tickborne pathogen versus viral syndrome.  Workup in progress   Level of care: Telemetry Medical  Consultants:  ID  Procedures:  None  Antimicrobials: Doxycycline   Subjective: Seen and salmon.  Sitting up in chair.  States she feels like she has "water on the lungs" Objective: Vitals:   06/30/22 1624 06/30/22 2201 07/01/22 0434 07/01/22 0834  BP: (!) 142/61 (!) 151/70 (!) 149/56 (!) 159/96  Pulse: 74 75 81 83  Resp: 18 16 18 18   Temp: 98.1 F (36.7 C) 97.7 F (36.5 C) 97.8 F (36.6 C) 97.7 F (36.5 C)  TempSrc:  Oral  Oral  SpO2: 97% 98% 99% 100%  Weight:      Height:        Intake/Output Summary (Last 24 hours) at 07/01/2022 1215 Last data filed at 06/30/2022 2200 Gross per 24 hour  Intake 480 ml  Output --  Net 480 ml   Filed Weights   06/27/22 1949  Weight: 94.8 kg    Examination:  General exam: No acute distress Respiratory system: Lungs clear.  Normal work of breathing.  Room air Cardiovascular system: S1-S2, RRR, no murmurs, no  pedal edema Gastrointestinal system: Soft, NT/ND, normal bowel sounds Central nervous system: Alert and oriented. No focal neurological deficits. Extremities: Symmetric 5 x 5 power. Skin: No rashes, lesions or ulcers Psychiatry: Judgement and insight appear normal. Mood & affect appropriate.     Data Reviewed: I have  personally reviewed following labs and imaging studies  CBC: Recent Labs  Lab 06/27/22 2006 06/28/22 0559 06/30/22 0536 07/01/22 0835  WBC 8.3 9.3 10.2 15.4*  NEUTROABS 6.9 7.8* 7.1 10.5*  HGB 11.6* 10.7* 10.1* 11.5*  HCT 32.8* 30.8* 28.8* 32.8*  MCV 83.9 85.1 84.0 84.5  PLT 94* 91* 117* 186   Basic Metabolic Panel: Recent Labs  Lab 06/27/22 2006 06/28/22 0559 06/30/22 0536 07/01/22 0835  NA 121* 126* 129* 132*  K 3.0* 3.4* 2.8* 4.0  CL 84* 96* 97* 99  CO2 24 21* 24 23  GLUCOSE 116* 118* 101* 122*  BUN 14 16 15 17   CREATININE 0.97 0.95 0.91 0.81  CALCIUM 8.2* 7.7* 7.9* 8.7*  MG  --  2.0  --   --    GFR: Estimated Creatinine Clearance: 59.8 mL/min (by C-G formula based on SCr of 0.81 mg/dL). Liver Function Tests: Recent Labs  Lab 06/27/22 2006 06/28/22 0559  AST 46* 44*  ALT 29 30  ALKPHOS 55 51  BILITOT 1.2 0.8  PROT 6.0* 5.9*  ALBUMIN 3.5 3.1*   Recent Labs  Lab 06/27/22 2006  LIPASE 30   No results for input(s): "AMMONIA" in the last 168 hours. Coagulation Profile: No results for input(s): "INR", "PROTIME" in the last 168 hours. Cardiac Enzymes: No results for input(s): "CKTOTAL", "CKMB", "CKMBINDEX", "TROPONINI" in the last 168 hours. BNP (last 3 results) No results for input(s): "PROBNP" in the last 8760 hours. HbA1C: No results for input(s): "HGBA1C" in the last 72 hours. CBG: No results for input(s): "GLUCAP" in the last 168 hours. Lipid Profile: No results for input(s): "CHOL", "HDL", "LDLCALC", "TRIG", "CHOLHDL", "LDLDIRECT" in the last 72 hours. Thyroid Function Tests: No results for input(s): "TSH", "T4TOTAL", "FREET4", "T3FREE", "THYROIDAB" in the last 72 hours. Anemia Panel: No results for input(s): "VITAMINB12", "FOLATE", "FERRITIN", "TIBC", "IRON", "RETICCTPCT" in the last 72 hours. Sepsis Labs: Recent Labs  Lab 06/28/22 0559  PROCALCITON 0.93    Recent Results (from the past 240 hour(s))  SARS Coronavirus 2 by RT PCR  (hospital order, performed in [facility] hospital lab) *cepheid single result test* Anterior Nasal Swab     Status: None   Collection Time: 06/27/22  8:07 PM   Specimen: Anterior Nasal Swab  Result Value Ref Range Status   SARS Coronavirus 2 by RT PCR NEGATIVE NEGATIVE Final    Comment: (NOTE) SARS-CoV-2 target nucleic acids are NOT DETECTED.  The SARS-CoV-2 RNA is generally detectable in upper and lower respiratory specimens during the acute phase of infection. The lowest concentration of SARS-CoV-2 viral copies this assay can detect is 250 copies / mL. A negative result does not preclude SARS-CoV-2 infection and should not be used as the sole basis for treatment or other patient management decisions.  A negative result may occur with improper specimen collection / handling, submission of specimen other than nasopharyngeal swab, presence of viral mutation(s) within the areas targeted by this assay, and inadequate number of viral copies (<250 copies / mL). A negative result must be combined with clinical observations, patient history, and epidemiological information.  Fact Sheet for Patients:   RoadLapTop.co.za  Fact Sheet for Healthcare Providers: http://kim-miller.com/  This test is not  yet approved or  cleared by the [place] and has been authorized for detection and/or diagnosis of SARS-CoV-2 by FDA under an Emergency Use Authorization (EUA).  This EUA will remain in effect (meaning this test can be used) for the duration of the COVID-19 declaration under Section 564(b)(1) of the Act, 21 U.S.C. section 360bbb-3(b)(1), unless the authorization is terminated or revoked sooner.  Performed at [facility], [address] [place], [place] [address]   Blood culture (routine x 2)     Status: None (Preliminary result)   Collection Time: 06/27/22 11:55 PM   Specimen: BLOOD  Result Value Ref Range Status   Specimen  Description BLOOD LEFT [facility]  Final   Special Requests   Final    BOTTLES DRAWN AEROBIC AND ANAEROBIC Blood Culture adequate volume   Culture   Final    NO GROWTH 3 DAYS Performed at [facility], [address] [place], [place] [address]    Report Status PENDING  Incomplete  Blood culture (routine x 2)     Status: None (Preliminary result)   Collection Time: 06/28/22 10:31 PM   Specimen: BLOOD LEFT ARM  Result Value Ref Range Status   Specimen Description BLOOD LEFT ARM  Final   Special Requests   Final    BOTTLES DRAWN AEROBIC AND ANAEROBIC Blood Culture results may not be optimal due to an excessive volume of blood received in culture bottles   Culture   Final    NO GROWTH 3 DAYS Performed at [facility], [address] [place], [place] [address]    Report Status PENDING  Incomplete  Respiratory (~20 pathogens) panel by PCR     Status: Abnormal   Collection Time: 06/29/22  8:14 AM   Specimen: Nasopharyngeal Swab; Respiratory  Result Value Ref Range Status   Adenovirus NOT DETECTED NOT DETECTED Final   Coronavirus 229E NOT DETECTED NOT DETECTED Final    Comment: (NOTE) The Coronavirus on the Respiratory Panel, DOES NOT test for the novel  Coronavirus (2019 nCoV)    Coronavirus HKU1 NOT DETECTED NOT DETECTED Final   Coronavirus NL63 NOT DETECTED NOT DETECTED Final   Coronavirus OC43 NOT DETECTED NOT DETECTED Final   Metapneumovirus NOT DETECTED NOT DETECTED Final   Rhinovirus / Enterovirus NOT DETECTED NOT DETECTED Final   Influenza A NOT DETECTED NOT DETECTED Final   Influenza B NOT DETECTED NOT DETECTED Final   Parainfluenza Virus 1 NOT DETECTED NOT DETECTED Final   Parainfluenza Virus 2 NOT DETECTED NOT DETECTED Final   Parainfluenza Virus 3 DETECTED (A) NOT DETECTED Final   Parainfluenza Virus 4 NOT DETECTED NOT DETECTED Final   Respiratory Syncytial Virus NOT DETECTED NOT DETECTED Final   Bordetella pertussis NOT DETECTED NOT DETECTED Final    Bordetella Parapertussis NOT DETECTED NOT DETECTED Final   Chlamydophila pneumoniae NOT DETECTED NOT DETECTED Final   Mycoplasma pneumoniae NOT DETECTED NOT DETECTED Final    Comment: Performed at [facility], [address] [place], [place] [address]         Radiology Studies: ECHOCARDIOGRAM COMPLETE  Result Date: 06/29/2022    ECHOCARDIOGRAM REPORT   Patient Name:   Gabrielle Morris [employer] Date of Exam: 06/29/2022 Medical Rec #:  [id number]             Height:       64.0 in Accession #:    [id number]            Weight:  209.0 lb Date of Birth:  August 25, 1940             BSA:          1.993 m Patient Age:    82 years              BP:           144/87 mmHg Patient Gender: F                     HR:           68 bpm. Exam Location:  [facility] Procedure: 2D Echo, Cardiac Doppler, Color Doppler and Strain Analysis Indications:     CHF  History:         Patient has no prior history of Echocardiogram examinations.                  CHF, PAD, Arrythmias:PVC, Signs/Symptoms:Chest Pain; Risk                  Factors:Hypertension.  Sonographer:     Mikki Harbor Referring Phys:  [id number] Tresa Moore Diagnosing Phys: Julien Nordmann MD  Sonographer Comments: Global longitudinal strain was attempted. IMPRESSIONS  1. Left ventricular ejection fraction, by estimation, is 55 to 60%. The left ventricle has normal function. The left ventricle has no regional wall motion abnormalities. Left ventricular diastolic parameters are indeterminate. The average left ventricular global longitudinal strain is -14.1 %.  2. Right ventricular systolic function is normal. The right ventricular size is normal. There is normal pulmonary artery systolic pressure.  3. Left atrial size was moderately dilated.  4. The mitral valve is normal in structure. Moderate mitral valve regurgitation. No evidence of mitral stenosis.  5. Tricuspid valve regurgitation is moderate.  6. The aortic valve is normal in structure. Aortic valve  regurgitation is not visualized. No aortic stenosis is present.  7. The inferior vena cava is normal in size with greater than 50% respiratory variability, suggesting right atrial pressure of 3 mmHg. FINDINGS  Left Ventricle: Left ventricular ejection fraction, by estimation, is 55 to 60%. The left ventricle has normal function. The left ventricle has no regional wall motion abnormalities. The average left ventricular global longitudinal strain is -14.1 %. The left ventricular internal cavity size was normal in size. There is no left ventricular hypertrophy. Left ventricular diastolic parameters are indeterminate. Right Ventricle: The right ventricular size is normal. No increase in right ventricular wall thickness. Right ventricular systolic function is normal. There is normal pulmonary artery systolic pressure. The tricuspid regurgitant velocity is 2.63 m/s, and  with an assumed right atrial pressure of 3 mmHg, the estimated right ventricular systolic pressure is 30.7 mmHg. Left Atrium: Left atrial size was moderately dilated. Right Atrium: Right atrial size was normal in size. Pericardium: There is no evidence of pericardial effusion. Mitral Valve: The mitral valve is normal in structure. Mild mitral annular calcification. Moderate mitral valve regurgitation. No evidence of mitral valve stenosis. MV peak gradient, 5.4 mmHg. The mean mitral valve gradient is 2.0 mmHg. Tricuspid Valve: The tricuspid valve is normal in structure. Tricuspid valve regurgitation is moderate . No evidence of tricuspid stenosis. Aortic Valve: The aortic valve is normal in structure. Aortic valve regurgitation is not visualized. No aortic stenosis is present. Aortic valve mean gradient measures 5.0 mmHg. Aortic valve peak gradient measures 11.3 mmHg. Aortic valve area, by VTI measures 2.50 cm. Pulmonic Valve: The pulmonic valve was normal in structure. Pulmonic valve regurgitation  is not visualized. No evidence of pulmonic stenosis.  Aorta: The aortic root is normal in size and structure. Venous: The inferior vena cava is normal in size with greater than 50% respiratory variability, suggesting right atrial pressure of 3 mmHg. IAS/Shunts: No atrial level shunt detected by color flow Doppler.  LEFT VENTRICLE PLAX 2D LVIDd:         5.00 cm   Diastology LVIDs:         3.55 cm   LV e' medial:    10.00 cm/s LV PW:         1.20 cm   LV E/e' medial:  11.4 LV IVS:        1.00 cm   LV e' lateral:   13.60 cm/s LVOT diam:     2.00 cm   LV E/e' lateral: 8.4 LV SV:         91 LV SV Index:   46        2D Longitudinal Strain LVOT Area:     3.14 cm  2D Strain GLS Avg:     -14.1 %  RIGHT VENTRICLE RV Basal diam:  4.30 cm RV Mid diam:    3.20 cm RV S prime:     15.20 cm/s TAPSE (M-mode): 2.7 cm LEFT ATRIUM             Index        RIGHT ATRIUM           Index LA diam:        4.80 cm 2.41 cm/m   RA Area:     21.70 cm LA Vol (A2C):   91.9 ml 46.10 ml/m  RA Volume:   69.90 ml  35.07 ml/m LA Vol (A4C):   63.0 ml 31.61 ml/m LA Biplane Vol: 75.2 ml 37.73 ml/m  AORTIC VALVE                     PULMONIC VALVE AV Area (Vmax):    2.19 cm      PV Vmax:       1.09 m/s AV Area (Vmean):   2.34 cm      PV Peak grad:  4.8 mmHg AV Area (VTI):     2.50 cm AV Vmax:           168.00 cm/s AV Vmean:          108.000 cm/s AV VTI:            0.363 m AV Peak Grad:      11.3 mmHg AV Mean Grad:      5.0 mmHg LVOT Vmax:         117.00 cm/s LVOT Vmean:        80.600 cm/s LVOT VTI:          0.289 m LVOT/AV VTI ratio: 0.80  AORTA Ao Root diam: 3.10 cm MITRAL VALVE                  TRICUSPID VALVE MV Area (PHT): 3.60 cm       TR Peak grad:   27.7 mmHg MV Area VTI:   2.24 cm       TR Vmax:        263.00 cm/s MV Peak grad:  5.4 mmHg MV Mean grad:  2.0 mmHg       SHUNTS MV Vmax:       1.16 m/s       Systemic VTI:  0.29 m  MV Vmean:      61.0 cm/s      Systemic Diam: 2.00 cm MV Decel Time: 211 msec MR Peak grad:    97.0 mmHg MR Mean grad:    64.5 mmHg MR Vmax:         492.50 cm/s MR  Vmean:        370.0 cm/s MR PISA:         1.27 cm MR PISA Eff ROA: 24 mm MR PISA Radius:  0.45 cm MV E velocity: 114.00 cm/s MV A velocity: 91.20 cm/s MV E/A ratio:  1.25 Julien Nordmann MD Electronically signed by Julien Nordmann MD Signature Date/Time: 06/29/2022/4:34:25 PM    Final         Scheduled Meds:  aspirin EC  81 mg Oral Daily   atorvastatin  40 mg Oral QHS   doxycycline  100 mg Oral Q12H   losartan  50 mg Oral BID   methylPREDNISolone (SOLU-MEDROL) injection  40 mg Intravenous Daily   pantoprazole  40 mg Oral Daily   Continuous Infusions:     LOS: 3 days     Tresa Moore, MD [facility]   If 7PM-7AM, please contact night-coverage  07/01/2022, 12:15 PM

## 2022-07-02 DIAGNOSIS — R509 Fever, unspecified: Secondary | ICD-10-CM | POA: Diagnosis not present

## 2022-07-02 LAB — EHRLICHIA ANTIBODY PANEL
E chaffeensis (HGE) Ab, IgG: NEGATIVE
E chaffeensis (HGE) Ab, IgM: NEGATIVE
E. Chaffeensis (HME) IgM Titer: NEGATIVE
E.Chaffeensis (HME) IgG: NEGATIVE

## 2022-07-02 LAB — CULTURE, BLOOD (ROUTINE X 2)

## 2022-07-02 LAB — LYME DISEASE SEROLOGY W/REFLEX: Lyme Total Antibody EIA: NEGATIVE

## 2022-07-02 MED ORDER — PREDNISONE 20 MG PO TABS: 20.0000 mg | ORAL_TABLET | Freq: Every day | ORAL | 0 refills | Status: AC

## 2022-07-02 MED ORDER — ALBUTEROL SULFATE HFA 108 (90 BASE) MCG/ACT IN AERS: 2.0000 | INHALATION_SPRAY | Freq: Four times a day (QID) | RESPIRATORY_TRACT | 0 refills | Status: AC | PRN

## 2022-07-02 MED ORDER — DOXYCYCLINE HYCLATE 100 MG PO CAPS: 100.0000 mg | ORAL_CAPSULE | Freq: Two times a day (BID) | ORAL | 0 refills | Status: AC

## 2022-07-02 MED ORDER — PREDNISONE 20 MG PO TABS
20.0000 mg | ORAL_TABLET | Freq: Every day | ORAL | Status: DC
  Administered 2022-07-02: 20 mg via ORAL
  Filled 2022-07-02: qty 1

## 2022-07-02 NOTE — TOC Initial Note (Signed)
Transition of Care [facility]) - Initial/Assessment Note    Patient Details  Name: Gabrielle Morris MRN: [id number] Date of Birth: 02-23-1941  Transition of Care [facility]) CM/SW Contact:    Chapman Fitch, RN Phone Number: 07/02/2022, 10:04 AM  Clinical Narrative:                   Admitted for: febrile illness  Admitted from: home.  Lives with her sister.  She is her sisters caregiver PCP: Graciela Husbands  Current home health/prior home health/DME: [facility]  Patient to discharge today.  Niece at bedside to transport Therapy recommending Home health.  Patient in agreement and states she does not have a preference of home health agency.  Referral made and accepted by Kandee Keen with [facility]  Referral made to [facility] with [employer] for RW to be delivered to room prior to discharge    Expected Discharge Plan: Home w Home Health Services Barriers to Discharge: Barriers Resolved   Patient Goals and CMS Choice Patient states their goals for this hospitalization and ongoing recovery are:: to go home CMS Medicare.gov Compare Post Acute Care list provided to:: Patient Choice offered to / list presented to : Patient      Expected Discharge Plan and Services     Post Acute Care Choice: Home Health Living arrangements for the past 2 months: Single Family Home Expected Discharge Date: 07/02/22                         HH Arranged: PT HH Agency: [facility] Date [facility] Agency Contacted: 07/02/22   Representative spoke with at [facility] Agency: Kandee Keen  Prior Living Arrangements/Services Living arrangements for the past 2 months: Single Family Home Lives with:: Siblings   Do you feel safe going back to the place where you live?: Yes          Current home services: DME    Activities of Daily Living Home Assistive Devices/Equipment: Cane (specify quad or straight) ADL Screening (condition at time of admission) Patient's cognitive ability adequate to safely complete daily activities?: Yes Is the  patient deaf or have difficulty hearing?: Yes Does the patient have difficulty seeing, even when wearing glasses/contacts?: No Does the patient have difficulty concentrating, remembering, or making decisions?: No Patient able to express need for assistance with ADLs?: Yes Does the patient have difficulty dressing or bathing?: No Independently performs ADLs?: Yes (appropriate for developmental age) Does the patient have difficulty walking or climbing stairs?: No Weakness of Legs: Both Weakness of Arms/Hands: None  Permission Sought/Granted                  Emotional Assessment              Admission diagnosis:  Hyponatremia [E87.1] [facility] spotted fever) [A77.0] Febrile illness, acute [R50.9] Patient Active Problem List   Diagnosis Date Noted   Febrile illness, acute 06/28/2022   Hyponatremia 06/28/2022   Hypokalemia 06/28/2022   GERD (gastroesophageal reflux disease) 06/28/2022   Elevated troponin 06/28/2022   Arthritis 06/30/2020   Ischemic colitis (HCC) 06/30/2020   Osteoporosis, 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 [place] [address] Phone: [phone] Fax: [phone]  [id number] [place], [place] - [address] [place] [address] Phone: [phone] Fax: [phone]     Social Determinants of Health (SDOH) Social History: SDOH Screenings   Food Insecurity: No Food Insecurity (06/28/2022)  Housing: Low Risk  (06/28/2022)  Transportation Needs: No Transportation Needs (06/28/2022)   Utilities: Not At Risk (06/28/2022)  Tobacco Use: Low Risk  (06/27/2022)   SDOH Interventions: Housing Interventions: Intervention Not Indicated   Readmission Risk Interventions     No data to display

## 2022-07-02 NOTE — Plan of Care (Signed)

## 2022-07-02 NOTE — Evaluation (Signed)
Occupational Therapy Evaluation Patient Details Name: Gabrielle Morris MRN: [id number] DOB: Nov 01, 1940 Today's Date: 07/02/2022   History of Present Illness 82 year old female who presents to the ED with chief complaint of headache, nausea, diarrhea, fever, weakness.  Patient has a history of tick exposure.  Tmax at home 101.4.  Symptoms started 5 days prior to admission.  4 tick bites noted last months.  Was prescribed outpatient doxycycline and had 3 doses.  No improvement in symptoms.  Presented to the ED. Positive for parainfluenza virus 3. Serologies for tickborne illnesses pending.   Clinical Impression   Patient agreeable to OT evaluation. Pt is alert and oriented x4, good safety awareness. PTA pt lived with her sister who she is the primary caregiver for. Pt was independent for ADLs, IADLs, functional mobility without an AD, and still driving. Pt is currently functioning at Mod I-I for self-care tasks, Mod I for functional transfers, and Mod I for functional mobility using a RW. No further OT needs identified. Pt is in agreement. Please reconsult if there is a change in functional status.    Recommendations for follow up therapy are one component of a multi-disciplinary discharge planning process, led by the attending physician.  Recommendations may be updated based on patient status, additional functional criteria and insurance authorization.   Assistance Recommended at Discharge PRN  Patient can return home with the following Assistance with cooking/housework;Assist for transportation;Help with stairs or ramp for entrance    Functional Status Assessment  Patient has had a recent decline in their functional status and demonstrates the ability to make significant improvements in function in a reasonable and predictable amount of time.  Equipment Recommendations  None recommended by OT    Recommendations for Other Services       Precautions / Restrictions  Precautions Precautions: Fall Restrictions Weight Bearing Restrictions: No      Mobility Bed Mobility               General bed mobility comments: NT, pt received/left in recliner    Transfers Overall transfer level: Modified independent Equipment used: None               General transfer comment: STS from recliner and toilet      Balance Overall balance assessment: Needs assistance   Sitting balance-Leahy Scale: Normal     Standing balance support: No upper extremity supported, Bilateral upper extremity supported, During functional activity Standing balance-Leahy Scale: Good       ADL either performed or assessed with clinical judgement   ADL Overall ADL's : Modified independent       General ADL Comments: Pt completed toilet transfer with Mod I (regular height toilet, use of grab bar), functional mobility (~15 ft) to the bathroom with Mod I using a RW, sinkside grooming INDly. Pt already dressed in personal clothes upon arrival and reports taking a shower yesterday in the hospital with niece present.     Vision Baseline Vision/History: 1 Wears glasses Patient Visual Report: No change from baseline       Perception     Praxis      Pertinent Vitals/Pain Pain Assessment Pain Assessment: No/denies pain     Hand Dominance Left   Extremity/Trunk Assessment Upper Extremity Assessment Upper Extremity Assessment: Overall WFL for tasks assessed   Lower Extremity Assessment Lower Extremity Assessment: Overall WFL for tasks assessed       Communication Communication Communication: No difficulties   Cognition Arousal/Alertness: Awake/alert Behavior During Therapy: WFL for tasks  assessed/performed Overall Cognitive Status: Within Functional Limits for tasks assessed         General Comments       Exercises     Shoulder Instructions      Home Living Family/patient expects to be discharged to:: Private residence Living Arrangements:  Other relatives (sister) Available Help at Discharge: Family;Available 24 hours/day (nieces and nephews helping her sister now) Type of Home: House Home Access: Level entry     Home Layout: One level     Bathroom Shower/Tub: Producer, television/film/video: Handicapped height Bathroom Accessibility: Yes   Home Equipment: Cane - quad;Shower seat;Grab bars - tub/shower;Grab bars - toilet;Hand held shower head   Additional Comments: Primary caregiver for sister      Prior Functioning/Environment Prior Level of Function : Independent/Modified Independent;Driving;History of Falls (last six months)             Mobility Comments: Independent no AD, 1 recent fall (slid on the floor after a bath due to weakness/sickness) ADLs Comments: Independent for ADLs/IADLs. Still driving. Primary caregiver for sister (ambulatory, dementia, needs assistance with ADLs/IADLs).        OT Problem List:        OT Treatment/Interventions:      OT Goals(Current goals can be found in the care plan section) Acute Rehab OT Goals Patient Stated Goal: return home OT Goal Formulation: All assessment and education complete, DC therapy Time For Goal Achievement: 07/16/22 Potential to Achieve Goals: Good  OT Frequency:      Co-evaluation              AM-PAC OT "6 Clicks" Daily Activity     Outcome Measure Help from another person eating meals?: None Help from another person taking care of personal grooming?: None Help from another person toileting, which includes using toliet, bedpan, or urinal?: None Help from another person bathing (including washing, rinsing, drying)?: None Help from another person to put on and taking off regular upper body clothing?: None Help from another person to put on and taking off regular lower body clothing?: None 6 Click Score: 24   End of Session Equipment Utilized During Treatment: Rolling walker (2 wheels) Nurse Communication: Mobility status  Activity  Tolerance: Patient tolerated treatment well Patient left: in chair;with call bell/phone within reach;with family/visitor present  OT Visit Diagnosis: Other abnormalities of gait and mobility (R26.89)                Time: 1610-9604 OT Time Calculation (min): 10 min Charges:  OT General Charges $OT Visit: 1 Visit OT Evaluation $OT Eval Low Complexity: 1 Low  [facility] MS, OTR/L ascom [phone]  07/02/22, 11:58 AM

## 2022-07-02 NOTE — Care Management Important Message (Signed)
Important Message  Patient Details  Name: Gabrielle Morris MRN: [id number] Date of Birth: [birth year]   Medicare Important Message Given:  Yes  Reviewed Medicare IM with niece over room phone [phone].  Copy of Medicare IM to be delivered to room for patient's reference.   Johnell Comings 07/02/2022, 11:08 AM

## 2022-07-02 NOTE — Discharge Summary (Signed)
Physician Discharge Summary  Gabrielle Morris ZOX:[id number] DOB: May 02, 1940 DOA: 06/27/2022  PCP: Lynnea Ferrier, MD  Admit date: 06/27/2022 Discharge date: 07/02/2022  Admitted From: Home Disposition:  Home with home health  Recommendations for Outpatient Follow-up:  Follow up with PCP in 1-2 weeks   Home Health:Yes PT Equipment/Devices:None   Discharge Condition:Stable  CODE STATUS:FULL  Diet recommendation: Reg  Brief/Interim Summary: 82 year old female who presents to the ED with chief complaint of headache, nausea, diarrhea, fever, weakness.  Patient has a history of tick exposure.  Tmax at home 101.4.  Symptoms started 5 days prior to admission.  4 tick bites noted last months.  Was prescribed outpatient doxycycline and had 3 doses.  No improvement in symptoms.  Presented to the ED.   Is currently on broad-spectrum IV antibiotics.  Infectious disease consultation requested.  Recommendations greatly appreciated.  Continue broad antimicrobial coverage until formal ID evaluation.   5/3: Infectious disease consultation appreciated.  Antibiotics de-escalated to doxycycline monotherapy.  Remainder of serologies for tickborne illnesses pending at this time.   5/4: Patient doing well overall.  RVP positive for parainfluenza virus.  Echocardiogram reassuring.  5/6: Patient doing well.  On room air.  Respiratory status improved.  Lyme disease negative.  Ehrlichia pending at time of discharge.  Recommend completion of course of doxycycline.  Prescribed on discharge.  Will prescribe small amount of p.o. prednisone for respiratory complaints related to viral syndrome.  Stable for DC home at this time.  Follow-up outpatient PCP.    Discharge Diagnoses:  Principal Problem:   Febrile illness, acute Active Problems:   PAD (peripheral artery disease) (HCC)   HTN (hypertension)   Hyponatremia   Hypokalemia   GERD (gastroesophageal reflux disease)   Elevated troponin  Febrile  illness Tmax at home 101.4  Fever Tmax 100.4 since admission.  History of 4 tick bites in the last month.  Has been on doxycycline.  [facility] spotted fever negative, HCV negative.  Lyme negative.  Ehrlichia pending.  .  Switch to p.o. doxycycline 5/3 Plan: Complete course of p.o. doxycycline.  10-day total course.  Follow-up outpatient PCP.   Parainfluenza virus Viral syndrome.  May be driving some of the febrile illness as above.  Unclear whether fever and current presentation is more related to respiratory viral syndrome versus possible tickborne illness. Plan: Prednisone 20 mg daily x 4 additional days.   Discharge Instructions  Discharge Instructions     Diet - low sodium heart healthy   Complete by: As directed    Increase activity slowly   Complete by: As directed       Allergies as of 07/02/2022       Reactions   Anesthetics, Amide         Medication List     TAKE these medications    acetaminophen 500 MG tablet Commonly known as: TYLENOL Take 500 mg by mouth every 6 (six) hours as needed for mild pain.   albuterol 108 (90 Base) MCG/ACT inhaler Commonly known as: VENTOLIN HFA Inhale 2 puffs into the lungs every 6 (six) hours as needed for wheezing or shortness of breath.   alendronate 70 MG tablet Commonly known as: FOSAMAX Take 70 mg by mouth once a week.   aspirin EC 81 MG tablet Take 81 mg by mouth daily. am   atorvastatin 40 MG tablet Commonly known as: LIPITOR Take 40 mg by mouth at bedtime.   Calcium Citrate-Vitamin D 315-250 MG-UNIT Tabs Take 1 tablet  by mouth daily.   cyanocobalamin 1000 MCG tablet Commonly known as: VITAMIN B12 Take 1,000 mcg by mouth daily.   donepezil 5 MG tablet Commonly known as: ARICEPT Take 5 mg by mouth daily.   doxycycline 100 MG capsule Commonly known as: VIBRAMYCIN Take 1 capsule (100 mg total) by mouth 2 (two) times daily for 3 days.   fluticasone 50 MCG/ACT nasal spray Commonly known as:  FLONASE Place 2 sprays into the nose as needed for allergies. Pm   hydrochlorothiazide 25 MG tablet Commonly known as: HYDRODIURIL Take 25 mg by mouth daily.   losartan 50 MG tablet Commonly known as: COZAAR Take 50 mg by mouth 2 (two) times daily.   omeprazole 20 MG capsule Commonly known as: PRILOSEC Take 20 mg by mouth daily.   ondansetron 4 MG disintegrating tablet Commonly known as: ZOFRAN-ODT Take 4 mg by mouth every 8 (eight) hours as needed for refractory nausea / vomiting, vomiting or nausea.   potassium chloride SA 20 MEQ tablet Commonly known as: KLOR-CON M Take 20 mEq by mouth daily.   predniSONE 20 MG tablet Commonly known as: DELTASONE Take 1 tablet (20 mg total) by mouth daily with breakfast for 3 days. Start taking on: Jul 03, 2022   Vitamin D3 50 MCG (2000 UT) Tabs Take 2,000 Units by mouth every morning. AM               Durable Medical Equipment  (From admission, onward)           Start     Ordered   07/01/22 1411  For home use only DME Walker rolling  Once       Question Answer Comment  Walker: With 5 Inch Wheels   Patient needs a walker to treat with the following condition Unsteadiness on feet      07/01/22 1411            Allergies  Allergen Reactions   Anesthetics, Amide     Consultations: None   Procedures/Studies: ECHOCARDIOGRAM COMPLETE  Result Date: 06/29/2022    ECHOCARDIOGRAM REPORT   Patient Name:   Gabrielle Morris [facility] Date of Exam: 06/29/2022 Medical Rec #:  [id number]             Height:       64.0 in Accession #:    [id number]            Weight:       209.0 lb Date of Birth:  Mar 05, 1940             BSA:          1.993 m Patient Age:    82 years              BP:           144/87 mmHg Patient Gender: F                     HR:           68 bpm. Exam Location:  [facility] Procedure: 2D Echo, Cardiac Doppler, Color Doppler and Strain Analysis Indications:     CHF  History:         Patient has no prior history of  Echocardiogram examinations.                  CHF, PAD, Arrythmias:PVC, Signs/Symptoms:Chest Pain; Risk  Factors:Hypertension.  Sonographer:     Mikki Harbor Referring Phys:  [id number] Tresa Moore Diagnosing Phys: Julien Nordmann MD  Sonographer Comments: Global longitudinal strain was attempted. IMPRESSIONS  1. Left ventricular ejection fraction, by estimation, is 55 to 60%. The left ventricle has normal function. The left ventricle has no regional wall motion abnormalities. Left ventricular diastolic parameters are indeterminate. The average left ventricular global longitudinal strain is -14.1 %.  2. Right ventricular systolic function is normal. The right ventricular size is normal. There is normal pulmonary artery systolic pressure.  3. Left atrial size was moderately dilated.  4. The mitral valve is normal in structure. Moderate mitral valve regurgitation. No evidence of mitral stenosis.  5. Tricuspid valve regurgitation is moderate.  6. The aortic valve is normal in structure. Aortic valve regurgitation is not visualized. No aortic stenosis is present.  7. The inferior vena cava is normal in size with greater than 50% respiratory variability, suggesting right atrial pressure of 3 mmHg. FINDINGS  Left Ventricle: Left ventricular ejection fraction, by estimation, is 55 to 60%. The left ventricle has normal function. The left ventricle has no regional wall motion abnormalities. The average left ventricular global longitudinal strain is -14.1 %. The left ventricular internal cavity size was normal in size. There is no left ventricular hypertrophy. Left ventricular diastolic parameters are indeterminate. Right Ventricle: The right ventricular size is normal. No increase in right ventricular wall thickness. Right ventricular systolic function is normal. There is normal pulmonary artery systolic pressure. The tricuspid regurgitant velocity is 2.63 m/s, and  with an assumed right atrial  pressure of 3 mmHg, the estimated right ventricular systolic pressure is 30.7 mmHg. Left Atrium: Left atrial size was moderately dilated. Right Atrium: Right atrial size was normal in size. Pericardium: There is no evidence of pericardial effusion. Mitral Valve: The mitral valve is normal in structure. Mild mitral annular calcification. Moderate mitral valve regurgitation. No evidence of mitral valve stenosis. MV peak gradient, 5.4 mmHg. The mean mitral valve gradient is 2.0 mmHg. Tricuspid Valve: The tricuspid valve is normal in structure. Tricuspid valve regurgitation is moderate . No evidence of tricuspid stenosis. Aortic Valve: The aortic valve is normal in structure. Aortic valve regurgitation is not visualized. No aortic stenosis is present. Aortic valve mean gradient measures 5.0 mmHg. Aortic valve peak gradient measures 11.3 mmHg. Aortic valve area, by VTI measures 2.50 cm. Pulmonic Valve: The pulmonic valve was normal in structure. Pulmonic valve regurgitation is not visualized. No evidence of pulmonic stenosis. Aorta: The aortic root is normal in size and structure. Venous: The inferior vena cava is normal in size with greater than 50% respiratory variability, suggesting right atrial pressure of 3 mmHg. IAS/Shunts: No atrial level shunt detected by color flow Doppler.  LEFT VENTRICLE PLAX 2D LVIDd:         5.00 cm   Diastology LVIDs:         3.55 cm   LV e' medial:    10.00 cm/s LV PW:         1.20 cm   LV E/e' medial:  11.4 LV IVS:        1.00 cm   LV e' lateral:   13.60 cm/s LVOT diam:     2.00 cm   LV E/e' lateral: 8.4 LV SV:         91 LV SV Index:   46        2D Longitudinal Strain LVOT Area:     3.14 cm  2D Strain GLS Avg:     -14.1 %  RIGHT VENTRICLE RV Basal diam:  4.30 cm RV Mid diam:    3.20 cm RV S prime:     15.20 cm/s TAPSE (M-mode): 2.7 cm LEFT ATRIUM             Index        RIGHT ATRIUM           Index LA diam:        4.80 cm 2.41 cm/m   RA Area:     21.70 cm LA Vol (A2C):   91.9 ml  46.10 ml/m  RA Volume:   69.90 ml  35.07 ml/m LA Vol (A4C):   63.0 ml 31.61 ml/m LA Biplane Vol: 75.2 ml 37.73 ml/m  AORTIC VALVE                     PULMONIC VALVE AV Area (Vmax):    2.19 cm      PV Vmax:       1.09 m/s AV Area (Vmean):   2.34 cm      PV Peak grad:  4.8 mmHg AV Area (VTI):     2.50 cm AV Vmax:           168.00 cm/s AV Vmean:          108.000 cm/s AV VTI:            0.363 m AV Peak Grad:      11.3 mmHg AV Mean Grad:      5.0 mmHg LVOT Vmax:         117.00 cm/s LVOT Vmean:        80.600 cm/s LVOT VTI:          0.289 m LVOT/AV VTI ratio: 0.80  AORTA Ao Root diam: 3.10 cm MITRAL VALVE                  TRICUSPID VALVE MV Area (PHT): 3.60 cm       TR Peak grad:   27.7 mmHg MV Area VTI:   2.24 cm       TR Vmax:        263.00 cm/s MV Peak grad:  5.4 mmHg MV Mean grad:  2.0 mmHg       SHUNTS MV Vmax:       1.16 m/s       Systemic VTI:  0.29 m MV Vmean:      61.0 cm/s      Systemic Diam: 2.00 cm MV Decel Time: 211 msec MR Peak grad:    97.0 mmHg MR Mean grad:    64.5 mmHg MR Vmax:         492.50 cm/s MR Vmean:        370.0 cm/s MR PISA:         1.27 cm MR PISA Eff ROA: 24 mm MR PISA Radius:  0.45 cm MV E velocity: 114.00 cm/s MV A velocity: 91.20 cm/s MV E/A ratio:  1.25 Julien Nordmann MD Electronically signed by Julien Nordmann MD Signature Date/Time: 06/29/2022/4:34:25 PM    Final    DG Chest Port 1 View  Result Date: 06/29/2022 CLINICAL DATA:  Fever, hypoxia EXAM: PORTABLE CHEST 1 VIEW COMPARISON:  Portable exam at 0829 hrs compared to 05/20/2010 FINDINGS: Upper normal heart size. Atherosclerotic calcification aorta. Pulmonary vascularity normal. Enlarged hila bilaterally. New interstitial infiltrates in the mid to lower lungs bilaterally, question edema versus infection. No  pleural effusion or pneumothorax. Bones demineralized. IMPRESSION: New interstitial infiltrates of the mid to lower lungs question pulmonary edema versus infection. Enlarged pulmonary hila bilaterally concerning for hilar  adenopathy/mass; CT chest with contrast recommended for further evaluation. Electronically Signed   By: Ulyses Southward M.D.   On: 06/29/2022 09:24   CT Head Wo Contrast  Result Date: 06/27/2022 CLINICAL DATA:  Headache EXAM: CT HEAD WITHOUT CONTRAST TECHNIQUE: Contiguous axial images were obtained from the base of the skull through the vertex without intravenous contrast. RADIATION DOSE REDUCTION: This exam was performed according to the departmental dose-optimization program which includes automated exposure control, adjustment of the mA and/or kV according to patient size and/or use of iterative reconstruction technique. COMPARISON:  MRI head 05/08/2020 FINDINGS: Brain: No evidence of acute infarction, hemorrhage, hydrocephalus, extra-axial collection or mass lesion/mass effect. Vascular: Atherosclerotic calcifications are present within the cavernous internal carotid arteries. Skull: Normal. Negative for fracture or focal lesion. Sinuses/Orbits: No acute finding. Other: None. IMPRESSION: No acute intracranial abnormality. Electronically Signed   By: Darliss Cheney M.D.   On: 06/27/2022 20:49      Subjective: Seen and examined on the day of discharge.  Stable no distress.  Appropriate for discharge home.  Discharge Exam: Vitals:   07/02/22 0338 07/02/22 0757  BP: (!) 148/57 (!) 160/62  Pulse: 73 77  Resp: 16 18  Temp: 98 F (36.7 C) 98.7 F (37.1 C)  SpO2: 98% 99%   Vitals:   07/01/22 1521 07/01/22 1954 07/02/22 0338 07/02/22 0757  BP: (!) 155/71 (!) 165/66 (!) 148/57 (!) 160/62  Pulse: 78 77 73 77  Resp: 20 20 16 18   Temp: 98.2 F (36.8 C) 98.1 F (36.7 C) 98 F (36.7 C) 98.7 F (37.1 C)  TempSrc:  Oral Oral   SpO2: 96% 98% 98% 99%  Weight:      Height:        General: Pt is alert, awake, not in acute distress Cardiovascular: RRR, S1/S2 +, no rubs, no gallops Respiratory: CTA bilaterally, no wheezing, no rhonchi Abdominal: Soft, NT, ND, bowel sounds + Extremities: no edema, no  cyanosis    The results of significant diagnostics from this hospitalization (including imaging, microbiology, ancillary and laboratory) are listed below for reference.     Microbiology: Recent Results (from the past 240 hour(s))  SARS Coronavirus 2 by RT PCR (hospital order, performed in [facility] hospital lab) *cepheid single result test* Anterior Nasal Swab     Status: None   Collection Time: 06/27/22  8:07 PM   Specimen: Anterior Nasal Swab  Result Value Ref Range Status   SARS Coronavirus 2 by RT PCR NEGATIVE NEGATIVE Final    Comment: (NOTE) SARS-CoV-2 target nucleic acids are NOT DETECTED.  The SARS-CoV-2 RNA is generally detectable in upper and lower respiratory specimens during the acute phase of infection. The lowest concentration of SARS-CoV-2 viral copies this assay can detect is 250 copies / mL. A negative result does not preclude SARS-CoV-2 infection and should not be used as the sole basis for treatment or other patient management decisions.  A negative result may occur with improper specimen collection / handling, submission of specimen other than nasopharyngeal swab, presence of viral mutation(s) within the areas targeted by this assay, and inadequate number of viral copies (<250 copies / mL). A negative result must be combined with clinical observations, patient history, and epidemiological information.  Fact Sheet for Patients:   RoadLapTop.co.za  Fact Sheet for Healthcare Providers: http://kim-miller.com/  This  test is not yet approved or  cleared by the [place] and has been authorized for detection and/or diagnosis of SARS-CoV-2 by FDA under an Emergency Use Authorization (EUA).  This EUA will remain in effect (meaning this test can be used) for the duration of the COVID-19 declaration under Section 564(b)(1) of the Act, 21 U.S.C. section 360bbb-3(b)(1), unless the authorization is terminated  or revoked sooner.  Performed at [facility], [address] [place], [place] [address]   Blood culture (routine x 2)     Status: None (Preliminary result)   Collection Time: 06/27/22 11:55 PM   Specimen: BLOOD  Result Value Ref Range Status   Specimen Description BLOOD LEFT [facility]  Final   Special Requests   Final    BOTTLES DRAWN AEROBIC AND ANAEROBIC Blood Culture adequate volume   Culture   Final    NO GROWTH 4 DAYS Performed at [facility], [address] [place], [place] [address]    Report Status PENDING  Incomplete  Blood culture (routine x 2)     Status: None (Preliminary result)   Collection Time: 06/28/22 10:31 PM   Specimen: BLOOD LEFT ARM  Result Value Ref Range Status   Specimen Description BLOOD LEFT ARM  Final   Special Requests   Final    BOTTLES DRAWN AEROBIC AND ANAEROBIC Blood Culture results may not be optimal due to an excessive volume of blood received in culture bottles   Culture   Final    NO GROWTH 4 DAYS Performed at [facility], [address] [place], [place] [address]    Report Status PENDING  Incomplete  Respiratory (~20 pathogens) panel by PCR     Status: Abnormal   Collection Time: 06/29/22  8:14 AM   Specimen: Nasopharyngeal Swab; Respiratory  Result Value Ref Range Status   Adenovirus NOT DETECTED NOT DETECTED Final   Coronavirus 229E NOT DETECTED NOT DETECTED Final    Comment: (NOTE) The Coronavirus on the Respiratory Panel, DOES NOT test for the novel  Coronavirus (2019 nCoV)    Coronavirus HKU1 NOT DETECTED NOT DETECTED Final   Coronavirus NL63 NOT DETECTED NOT DETECTED Final   Coronavirus OC43 NOT DETECTED NOT DETECTED Final   Metapneumovirus NOT DETECTED NOT DETECTED Final   Rhinovirus / Enterovirus NOT DETECTED NOT DETECTED Final   Influenza A NOT DETECTED NOT DETECTED Final   Influenza B NOT DETECTED NOT DETECTED Final   Parainfluenza Virus 1 NOT DETECTED NOT DETECTED Final   Parainfluenza Virus  2 NOT DETECTED NOT DETECTED Final   Parainfluenza Virus 3 DETECTED (A) NOT DETECTED Final   Parainfluenza Virus 4 NOT DETECTED NOT DETECTED Final   Respiratory Syncytial Virus NOT DETECTED NOT DETECTED Final   Bordetella pertussis NOT DETECTED NOT DETECTED Final   Bordetella Parapertussis NOT DETECTED NOT DETECTED Final   Chlamydophila pneumoniae NOT DETECTED NOT DETECTED Final   Mycoplasma pneumoniae NOT DETECTED NOT DETECTED Final    Comment: Performed at [facility], [address] [place], [place] [address]     Labs: BNP (last 3 results) Recent Labs    06/29/22 1054  BNP 827.5*   Basic Metabolic Panel: Recent Labs  Lab 06/27/22 2006 06/28/22 0559 06/30/22 0536 07/01/22 0835  NA 121* 126* 129* 132*  K 3.0* 3.4* 2.8* 4.0  CL 84* 96* 97* 99  CO2 24 21* 24 23  GLUCOSE 116* 118* 101* 122*  BUN 14 16 15 17   CREATININE 0.97 0.95 0.91  0.81  CALCIUM 8.2* 7.7* 7.9* 8.7*  MG  --  2.0  --   --    Liver Function Tests: Recent Labs  Lab 06/27/22 2006 06/28/22 0559  AST 46* 44*  ALT 29 30  ALKPHOS 55 51  BILITOT 1.2 0.8  PROT 6.0* 5.9*  ALBUMIN 3.5 3.1*   Recent Labs  Lab 06/27/22 2006  LIPASE 30   No results for input(s): "AMMONIA" in the last 168 hours. CBC: Recent Labs  Lab 06/27/22 2006 06/28/22 0559 06/30/22 0536 07/01/22 0835  WBC 8.3 9.3 10.2 15.4*  NEUTROABS 6.9 7.8* 7.1 10.5*  HGB 11.6* 10.7* 10.1* 11.5*  HCT 32.8* 30.8* 28.8* 32.8*  MCV 83.9 85.1 84.0 84.5  PLT 94* 91* 117* 186   Cardiac Enzymes: No results for input(s): "CKTOTAL", "CKMB", "CKMBINDEX", "TROPONINI" in the last 168 hours. BNP: Invalid input(s): "POCBNP" CBG: No results for input(s): "GLUCAP" in the last 168 hours. D-Dimer No results for input(s): "DDIMER" in the last 72 hours. Hgb A1c No results for input(s): "HGBA1C" in the last 72 hours. Lipid Profile No results for input(s): "CHOL", "HDL", "LDLCALC", "TRIG", "CHOLHDL", "LDLDIRECT" in the last 72 hours. Thyroid  function studies No results for input(s): "TSH", "T4TOTAL", "T3FREE", "THYROIDAB" in the last 72 hours.  Invalid input(s): "FREET3" Anemia work up No results for input(s): "VITAMINB12", "FOLATE", "FERRITIN", "TIBC", "IRON", "RETICCTPCT" in the last 72 hours. Urinalysis    Component Value Date/Time   COLORURINE YELLOW (A) 06/28/2022 0559   APPEARANCEUR CLEAR (A) 06/28/2022 0559   LABSPEC 1.008 06/28/2022 0559   PHURINE 6.0 06/28/2022 0559   GLUCOSEU NEGATIVE 06/28/2022 0559   HGBUR NEGATIVE 06/28/2022 0559   BILIRUBINUR NEGATIVE 06/28/2022 0559   KETONESUR NEGATIVE 06/28/2022 0559   PROTEINUR 30 (A) 06/28/2022 0559   NITRITE NEGATIVE 06/28/2022 0559   LEUKOCYTESUR NEGATIVE 06/28/2022 0559   Sepsis Labs Recent Labs  Lab 06/27/22 2006 06/28/22 0559 06/30/22 0536 07/01/22 0835  WBC 8.3 9.3 10.2 15.4*   Microbiology Recent Results (from the past 240 hour(s))  SARS Coronavirus 2 by RT PCR (hospital order, performed in [facility] hospital lab) *cepheid single result test* Anterior Nasal Swab     Status: None   Collection Time: 06/27/22  8:07 PM   Specimen: Anterior Nasal Swab  Result Value Ref Range Status   SARS Coronavirus 2 by RT PCR NEGATIVE NEGATIVE Final    Comment: (NOTE) SARS-CoV-2 target nucleic acids are NOT DETECTED.  The SARS-CoV-2 RNA is generally detectable in upper and lower respiratory specimens during the acute phase of infection. The lowest concentration of SARS-CoV-2 viral copies this assay can detect is 250 copies / mL. A negative result does not preclude SARS-CoV-2 infection and should not be used as the sole basis for treatment or other patient management decisions.  A negative result may occur with improper specimen collection / handling, submission of specimen other than nasopharyngeal swab, presence of viral mutation(s) within the areas targeted by this assay, and inadequate number of viral copies (<250 copies / mL). A negative result must be  combined with clinical observations, patient history, and epidemiological information.  Fact Sheet for Patients:   RoadLapTop.co.za  Fact Sheet for Healthcare Providers: http://kim-miller.com/  This test is not yet approved or  cleared by the [place] FDA and has been authorized for detection and/or diagnosis of SARS-CoV-2 by FDA under an Emergency Use Authorization (EUA).  This EUA will remain in effect (meaning this test can be used) for the duration of the COVID-19 declaration  under Section 564(b)(1) of the Act, 21 U.S.C. section 360bbb-3(b)(1), unless the authorization is terminated or revoked sooner.  Performed at [facility], [address] [place], [place] [address]   Blood culture (routine x 2)     Status: None (Preliminary result)   Collection Time: 06/27/22 11:55 PM   Specimen: BLOOD  Result Value Ref Range Status   Specimen Description BLOOD LEFT [facility]  Final   Special Requests   Final    BOTTLES DRAWN AEROBIC AND ANAEROBIC Blood Culture adequate volume   Culture   Final    NO GROWTH 4 DAYS Performed at [facility], [address], [place], [place] [address]    Report Status PENDING  Incomplete  Blood culture (routine x 2)     Status: None (Preliminary result)   Collection Time: 06/28/22 10:31 PM   Specimen: BLOOD LEFT ARM  Result Value Ref Range Status   Specimen Description BLOOD LEFT ARM  Final   Special Requests   Final    BOTTLES DRAWN AEROBIC AND ANAEROBIC Blood Culture results may not be optimal due to an excessive volume of blood received in culture bottles   Culture   Final    NO GROWTH 4 DAYS Performed at [facility], [address] [place], [place] [address]    Report Status PENDING  Incomplete  Respiratory (~20 pathogens) panel by PCR     Status: Abnormal   Collection Time: 06/29/22  8:14 AM   Specimen: Nasopharyngeal Swab; Respiratory  Result Value Ref Range Status    Adenovirus NOT DETECTED NOT DETECTED Final   Coronavirus 229E NOT DETECTED NOT DETECTED Final    Comment: (NOTE) The Coronavirus on the Respiratory Panel, DOES NOT test for the novel  Coronavirus (2019 nCoV)    Coronavirus HKU1 NOT DETECTED NOT DETECTED Final   Coronavirus NL63 NOT DETECTED NOT DETECTED Final   Coronavirus OC43 NOT DETECTED NOT DETECTED Final   Metapneumovirus NOT DETECTED NOT DETECTED Final   Rhinovirus / Enterovirus NOT DETECTED NOT DETECTED Final   Influenza A NOT DETECTED NOT DETECTED Final   Influenza B NOT DETECTED NOT DETECTED Final   Parainfluenza Virus 1 NOT DETECTED NOT DETECTED Final   Parainfluenza Virus 2 NOT DETECTED NOT DETECTED Final   Parainfluenza Virus 3 DETECTED (A) NOT DETECTED Final   Parainfluenza Virus 4 NOT DETECTED NOT DETECTED Final   Respiratory Syncytial Virus NOT DETECTED NOT DETECTED Final   Bordetella pertussis NOT DETECTED NOT DETECTED Final   Bordetella Parapertussis NOT DETECTED NOT DETECTED Final   Chlamydophila pneumoniae NOT DETECTED NOT DETECTED Final   Mycoplasma pneumoniae NOT DETECTED NOT DETECTED Final    Comment: Performed at [facility], [address] [place], [place] [address]     Time coordinating discharge: Over 30 minutes  SIGNED:   Tresa Moore, MD  [facility] 07/02/2022, 12:09 PM Pager   If 7PM-7AM, please contact night-coverage

## 2022-07-02 NOTE — Progress Notes (Signed)
AVS given and explained to patient and family. Awaiting for DME to be delivered.

## 2022-07-03 LAB — CULTURE, BLOOD (ROUTINE X 2): Culture: NO GROWTH

## 2022-07-05 ENCOUNTER — Encounter (INDEPENDENT_AMBULATORY_CARE_PROVIDER_SITE_OTHER): Payer: Medicare HMO

## 2022-07-05 ENCOUNTER — Ambulatory Visit (INDEPENDENT_AMBULATORY_CARE_PROVIDER_SITE_OTHER): Payer: Medicare HMO | Admitting: Vascular Surgery

## 2022-08-15 ENCOUNTER — Other Ambulatory Visit (INDEPENDENT_AMBULATORY_CARE_PROVIDER_SITE_OTHER): Payer: Self-pay | Admitting: Vascular Surgery

## 2022-08-15 DIAGNOSIS — I6523 Occlusion and stenosis of bilateral carotid arteries: Secondary | ICD-10-CM

## 2022-08-20 ENCOUNTER — Ambulatory Visit (INDEPENDENT_AMBULATORY_CARE_PROVIDER_SITE_OTHER): Payer: Medicare HMO

## 2022-08-20 ENCOUNTER — Ambulatory Visit (INDEPENDENT_AMBULATORY_CARE_PROVIDER_SITE_OTHER): Payer: Medicare HMO | Admitting: Vascular Surgery

## 2022-08-20 ENCOUNTER — Encounter (INDEPENDENT_AMBULATORY_CARE_PROVIDER_SITE_OTHER): Payer: Self-pay | Admitting: Vascular Surgery

## 2022-08-20 VITALS — BP 111/56 | HR 81 | Resp 16 | Wt 198.6 lb

## 2022-08-20 DIAGNOSIS — M1990 Unspecified osteoarthritis, unspecified site: Secondary | ICD-10-CM | POA: Diagnosis not present

## 2022-08-20 DIAGNOSIS — I739 Peripheral vascular disease, unspecified: Secondary | ICD-10-CM | POA: Diagnosis not present

## 2022-08-20 DIAGNOSIS — I6523 Occlusion and stenosis of bilateral carotid arteries: Secondary | ICD-10-CM

## 2022-08-20 DIAGNOSIS — I10 Essential (primary) hypertension: Secondary | ICD-10-CM | POA: Diagnosis not present

## 2022-08-20 NOTE — Progress Notes (Signed)
MRN : [id number]  Gabrielle Morris is a 82 y.o. (08-05-1940) female who presents with chief complaint of check carotid arteries.  History of Present Illness:   The patient is seen for follow up evaluation of carotid stenosis. The carotid stenosis followed by ultrasound.    The patient denies amaurosis fugax. There is no recent history of TIA symptoms or focal motor deficits. There is no prior documented CVA.   The patient is taking enteric-coated aspirin 81 mg daily.   There is no history of migraine headaches. There is no history of seizures.   The patient has a history of coronary artery disease, no recent episodes of angina or shortness of breath. The patient denies PAD or claudication symptoms. There is a history of hyperlipidemia which is being treated with a statin.     Duplex ultrasound of the carotid arteries demonstrates a stable 1-39% stenosis of the right internal carotid artery and 40-59% LICA stenosis.  No significant change compared to the previous study.  No outpatient medications have been marked as taking for the 08/20/22 encounter (Appointment) with Gilda Crease, Latina Craver, MD.    Past Medical History:  Diagnosis Date   Arthritis    JOINT STIFFNESS   Bilateral carpal tunnel syndrome    Dysrhythmia    PVC'S  LAST YEAR/ NONE RECENTLY/DR KOWALSKY    Fibrocystic breast disease    GERD (gastroesophageal reflux disease)    Hyperlipidemia    Hypertension    CONTROLLED ON MEDS   Neutropenia    Onychomycosis    Osteoporosis    Premature ventricular contractions    DR KOWALSKY CONSULTED, EKG IN JAN NEXT ONE IN JULY   Reflux    Right carotid artery occlusion    /SLIGHT NARROWING   Sleep apnea    Thrombocytopenia (HCC)    mild    Past Surgical History:  Procedure Laterality Date   BREAST EXCISIONAL BIOPSY Left 4098,1191   neg   CARDIAC CATHETERIZATION  2005   negatvie CAD, right partial carotid artery blockage   CATARACT EXTRACTION  Right 2010   CATARACT EXTRACTION W/PHACO Left 09/01/2014   Procedure: CATARACT EXTRACTION PHACO AND INTRAOCULAR LENS PLACEMENT (IOC);  Surgeon: Lockie Mola, MD;  Location: [facility];  Service: Ophthalmology;  Laterality: Left;   COLONOSCOPY WITH PROPOFOL N/A 03/17/2015   Procedure: COLONOSCOPY WITH PROPOFOL;  Surgeon: Scot Jun, MD;  Location: [facility];  Service: Endoscopy;  Laterality: N/A;   COLONOSCOPY WITH PROPOFOL N/A 08/15/2020   Procedure: COLONOSCOPY WITH PROPOFOL;  Surgeon: Regis Bill, MD;  Location: [facility] ENDOSCOPY;  Service: Endoscopy;  Laterality: N/A;   KYPHOPLASTY N/A 11/08/2017   Procedure: [id number];  Surgeon: Kennedy Bucker, MD;  Location: [facility] OR[facility];  Service: Orthopedics;  Laterality: N/A;   PATELLA FRACTURE SURGERY Right 2013   HARDWARE REMOVED 2015 SEPERATE SURGERY    Social History Social History   Tobacco Use   Smoking status: Never   Smokeless tobacco: Never  Vaping Use   Vaping Use: Never used  Substance Use Topics   Alcohol use: No   Drug use: No    Family History Family History  Problem Relation Age of Onset   Heart disease Other    Hypertension Other    Hypertension Maternal Aunt    Breast cancer Maternal Aunt    Hypertension Paternal Aunt    Breast cancer  Paternal Aunt     Allergies  Allergen Reactions   Anesthetics, Amide      REVIEW OF SYSTEMS (Negative unless checked)  Constitutional: [] Weight loss  [] Fever  [] Chills Cardiac: [] Chest pain   [] Chest pressure   [] Palpitations   [] Shortness of breath when laying flat   [] Shortness of breath with exertion. Vascular:  [x] Pain in legs with walking   [] Pain in legs at rest  [] History of DVT   [] Phlebitis   [] Swelling in legs   [] Varicose veins   [] Non-healing ulcers Pulmonary:   [] Uses home oxygen   [] Productive cough   [] Hemoptysis   [] Wheeze  [] COPD   [] Asthma Neurologic:  [] Dizziness   [] Seizures   [] History of stroke   [] History of TIA  [] Aphasia    [] Vissual changes   [] Weakness or numbness in arm   [] Weakness or numbness in leg Musculoskeletal:   [] Joint swelling   [] Joint pain   [] Low back pain Hematologic:  [] Easy bruising  [] Easy bleeding   [] Hypercoagulable state   [] Anemic Gastrointestinal:  [] Diarrhea   [] Vomiting  [] Gastroesophageal reflux/heartburn   [] Difficulty swallowing. Genitourinary:  [] Chronic kidney disease   [] Difficult urination  [] Frequent urination   [] Blood in urine Skin:  [] Rashes   [] Ulcers  Psychological:  [] History of anxiety   []  History of major depression.  Physical Examination  There were no vitals filed for this visit. There is no height or weight on file to calculate BMI. Gen: WD/WN, NAD Head: [place]/AT, No temporalis wasting.  Ear/Nose/Throat: Hearing grossly intact, nares w/o erythema or drainage Eyes: PER, EOMI, sclera nonicteric.  Neck: Supple, no masses.  No bruit or JVD.  Pulmonary:  Good air movement, no audible wheezing, no use of accessory muscles.  Cardiac: RRR, normal S1, S2, no Murmurs. Vascular:  carotid bruit noted Vessel Right Left  Radial Palpable Palpable  Carotid  Palpable  Palpable  Subclav  Palpable Palpable  Gastrointestinal: soft, non-distended. No guarding/no peritoneal signs.  Musculoskeletal: M/S 5/5 throughout.  No visible deformity.  Neurologic: CN 2-12 intact. Pain and light touch intact in extremities.  Symmetrical.  Speech is fluent. Motor exam as listed above. Psychiatric: Judgment intact, Mood & affect appropriate for pt's clinical situation. Dermatologic: No rashes or ulcers noted.  No changes consistent with cellulitis.   CBC Lab Results  Component Value Date   WBC 15.4 (H) 07/01/2022   HGB 11.5 (L) 07/01/2022   HCT 32.8 (L) 07/01/2022   MCV 84.5 07/01/2022   PLT 186 07/01/2022    BMET    Component Value Date/Time   NA 132 (L) 07/01/2022 0835   K 4.0 07/01/2022 0835   K 3.9 06/01/2013 1345   CL 99 07/01/2022 0835   CO2 23 07/01/2022 0835   GLUCOSE 122  (H) 07/01/2022 0835   BUN 17 07/01/2022 0835   CREATININE 0.81 07/01/2022 0835   CALCIUM 8.7 (L) 07/01/2022 0835   GFRNONAA >60 07/01/2022 0835   GFRAA >60 11/08/2017 1031   CrCl cannot be calculated (Patient's most recent lab result is older than the maximum 21 days allowed.).  COAG No results found for: "INR", "PROTIME"  Radiology No results found.   Assessment/Plan 1. Bilateral carotid artery stenosis Recommend:   Given the patient's asymptomatic subcritical stenosis no further invasive testing or surgery at this time.   Duplex ultrasound of the carotid arteries demonstrates a stable 1-39% stenosis of the right internal carotid artery and 40-59% LICA stenosis.    Continue antiplatelet therapy as prescribed Continue management of  CAD, HTN and Hyperlipidemia Healthy heart diet,  encouraged exercise at least 4 times per week Follow up in 12 months with duplex ultrasound and physical exam   - VAS US CAROTID; Future  2. PAD (peripheral artery disease) (HCC) Recommend:   I do not find evidence of life style limiting vascular disease. The patient specifically denies life style limitation.   Previous noninvasive studies including ABI's of the legs do not identify critical vascular problems.   The patient should continue walking and begin a more formal exercise program. The patient should continue his antiplatelet therapy and aggressive treatment of the lipid abnormalities.   The patient is instructed to call the office if there is a significant change in the lower extremity symptoms, particularly if a wound develops or there is an abrupt increase in leg pain.  3. Primary hypertension Continue antihypertensive medications as already ordered, these medications have been reviewed and there are no changes at this time.  4. Arthritis Continue NSAID medications as already ordered, these medications have been reviewed and there are no changes at this time.  Continued activity and  therapy was stressed.    Levora Dredge, MD  08/20/2022 12:40 PM

## 2022-08-26 ENCOUNTER — Encounter (INDEPENDENT_AMBULATORY_CARE_PROVIDER_SITE_OTHER): Payer: Self-pay | Admitting: Vascular Surgery

## 2022-09-03 ENCOUNTER — Other Ambulatory Visit: Payer: Self-pay | Admitting: Internal Medicine

## 2022-09-03 DIAGNOSIS — Z1231 Encounter for screening mammogram for malignant neoplasm of breast: Secondary | ICD-10-CM

## 2022-10-17 ENCOUNTER — Ambulatory Visit
Admission: RE | Admit: 2022-10-17 | Discharge: 2022-10-17 | Disposition: A | Discharge status: Home / Self Care | Payer: Medicare HMO | Source: Ambulatory Visit | Attending: Internal Medicine | Admitting: Internal Medicine

## 2022-10-17 DIAGNOSIS — Z1231 Encounter for screening mammogram for malignant neoplasm of breast: Secondary | ICD-10-CM | POA: Diagnosis not present

## 2023-07-16 ENCOUNTER — Encounter (INDEPENDENT_AMBULATORY_CARE_PROVIDER_SITE_OTHER): Payer: Self-pay

## 2023-08-26 ENCOUNTER — Ambulatory Visit (INDEPENDENT_AMBULATORY_CARE_PROVIDER_SITE_OTHER): Payer: Medicare HMO | Admitting: Vascular Surgery

## 2023-08-26 ENCOUNTER — Encounter (INDEPENDENT_AMBULATORY_CARE_PROVIDER_SITE_OTHER): Payer: Medicare HMO

## 2023-09-04 ENCOUNTER — Other Ambulatory Visit: Payer: Self-pay | Admitting: Internal Medicine

## 2023-09-04 DIAGNOSIS — Z1231 Encounter for screening mammogram for malignant neoplasm of breast: Secondary | ICD-10-CM

## 2023-09-06 ENCOUNTER — Other Ambulatory Visit (INDEPENDENT_AMBULATORY_CARE_PROVIDER_SITE_OTHER): Payer: Self-pay | Admitting: Vascular Surgery

## 2023-09-06 DIAGNOSIS — I6523 Occlusion and stenosis of bilateral carotid arteries: Secondary | ICD-10-CM

## 2023-09-09 ENCOUNTER — Encounter (INDEPENDENT_AMBULATORY_CARE_PROVIDER_SITE_OTHER): Payer: Self-pay | Admitting: Vascular Surgery

## 2023-09-09 ENCOUNTER — Ambulatory Visit (INDEPENDENT_AMBULATORY_CARE_PROVIDER_SITE_OTHER): Admitting: Vascular Surgery

## 2023-09-09 ENCOUNTER — Other Ambulatory Visit (INDEPENDENT_AMBULATORY_CARE_PROVIDER_SITE_OTHER)

## 2023-09-09 VITALS — BP 164/70 | HR 64 | Resp 18 | Wt 203.8 lb

## 2023-09-09 DIAGNOSIS — I739 Peripheral vascular disease, unspecified: Secondary | ICD-10-CM | POA: Diagnosis not present

## 2023-09-09 DIAGNOSIS — I10 Essential (primary) hypertension: Secondary | ICD-10-CM | POA: Diagnosis not present

## 2023-09-09 DIAGNOSIS — K219 Gastro-esophageal reflux disease without esophagitis: Secondary | ICD-10-CM

## 2023-09-09 DIAGNOSIS — I6523 Occlusion and stenosis of bilateral carotid arteries: Secondary | ICD-10-CM

## 2023-09-14 ENCOUNTER — Encounter (INDEPENDENT_AMBULATORY_CARE_PROVIDER_SITE_OTHER): Payer: Self-pay | Admitting: Vascular Surgery

## 2023-09-14 NOTE — Progress Notes (Signed)
 MRN : [id number]  Gabrielle Morris is a 83 y.o. (22-Nov-1940) female who presents with chief complaint of check carotid arteries.  History of Present Illness:   The patient is seen for follow up evaluation of carotid stenosis. The carotid stenosis followed by ultrasound.    The patient denies amaurosis fugax. There is no recent history of TIA symptoms or focal motor deficits. There is no prior documented CVA.   The patient is taking enteric-coated aspirin  81 mg daily.   There is no history of migraine headaches. There is no history of seizures.   The patient has a history of coronary artery disease, no recent episodes of angina or shortness of breath. The patient denies PAD or claudication symptoms. There is a history of hyperlipidemia which is being treated with a statin.     Duplex ultrasound of the carotid arteries demonstrates a stable 1-39% stenosis of the right internal carotid artery and 40-59% LICA stenosis.  No significant change compared to the previous study.  Current Meds  Medication Sig   acetaminophen  (TYLENOL ) 500 MG tablet Take 500 mg by mouth every 6 (six) hours as needed for mild pain.    alendronate (FOSAMAX) 70 MG tablet Take 70 mg by mouth once a week.   aspirin  81 MG EC tablet Take 81 mg by mouth daily. am   atorvastatin  (LIPITOR) 40 MG tablet Take 40 mg by mouth at bedtime.   Calcium  Citrate-Vitamin D 315-250 MG-UNIT TABS Take 1 tablet by mouth daily.    Cholecalciferol (VITAMIN D3) 2000 UNITS TABS Take 2,000 Units by mouth every morning. AM   cyanocobalamin (VITAMIN B12) 1000 MCG tablet Take 1,000 mcg by mouth daily.   donepezil (ARICEPT) 5 MG tablet Take 5 mg by mouth daily.   fluticasone (FLONASE) 50 MCG/ACT nasal spray Place 2 sprays into the nose as needed for allergies. Pm   hydrochlorothiazide  (HYDRODIURIL ) 25 MG tablet Take 25 mg by mouth daily.   losartan  (COZAAR ) 50 MG tablet Take 50 mg by mouth 2 (two) times daily.    omeprazole (PRILOSEC) 20 MG capsule Take 20 mg by mouth daily.    Past Medical History:  Diagnosis Date   Arthritis    JOINT STIFFNESS   Bilateral carpal tunnel syndrome    Dysrhythmia    PVC'S  LAST YEAR/ NONE RECENTLY/DR KOWALSKY    Fibrocystic breast disease    GERD (gastroesophageal reflux disease)    Hyperlipidemia    Hypertension    CONTROLLED ON MEDS   Neutropenia    Onychomycosis    Osteoporosis    Premature ventricular contractions    DR KOWALSKY CONSULTED, EKG IN JAN NEXT ONE IN JULY   Reflux    Right carotid artery occlusion    /SLIGHT NARROWING   Sleep apnea    Thrombocytopenia (HCC)    mild    Past Surgical History:  Procedure Laterality Date   BREAST EXCISIONAL BIOPSY Left [id number]   neg   CARDIAC CATHETERIZATION  2005   negatvie CAD, right partial carotid artery blockage   CATARACT EXTRACTION Right 2010   CATARACT EXTRACTION W/PHACO Left 09/01/2014   Procedure: CATARACT EXTRACTION PHACO AND INTRAOCULAR LENS PLACEMENT (IOC);  Surgeon: Dene Etienne, MD;  Location: [facility];  Service: Ophthalmology;  Laterality: Left;   COLONOSCOPY WITH PROPOFOL  N/A 03/17/2015   Procedure: COLONOSCOPY WITH PROPOFOL ;  Surgeon: Lamar  ONEIDA Holmes, MD;  Location: [facility] END[facility]COPY;  Service: Endoscopy;  Laterality: N/A;   COLONOSCOPY WITH PROPOFOL  N/A 08/15/2020   Procedure: COLONOSCOPY WITH PROPOFOL ;  Surgeon: Maryruth Ole ONEIDA, MD;  Location: [facility] END[facility];  Service: Endoscopy;  Laterality: N/A;   KYPHOPLASTY N/A 11/08/2017   Procedure: [id number];  Surgeon: Kathlynn Sharper, MD;  Location: [facility];  Service: Orthopedics;  Laterality: N/A;   PATELLA FRACTURE SURGERY Right 2013   HARDWARE REMOVED 2015 SEPERATE SURGERY    Social History Social History   Tobacco Use   Smoking status: Never   Smokeless tobacco: Never  Vaping Use   Vaping status: Never Used  Substance Use Topics   Alcohol use: No   Drug use: No    Family History Family History  Problem  Relation Age of Onset   Heart disease Other    Hypertension Other    Hypertension Maternal Aunt    Breast cancer Maternal Aunt    Hypertension Paternal Aunt    Breast cancer Paternal Aunt     Allergies  Allergen Reactions   Anesthetics, Amide      REVIEW OF SYSTEMS (Negative unless checked)  Constitutional: [] Weight loss  [] Fever  [] Chills Cardiac: [] Chest pain   [] Chest pressure   [] Palpitations   [] Shortness of breath when laying flat   [] Shortness of breath with exertion. Vascular:  [x] Pain in legs with walking   [] Pain in legs at rest  [] History of DVT   [] Phlebitis   [] Swelling in legs   [] Varicose veins   [] Non-healing ulcers Pulmonary:   [] Uses home oxygen   [] Productive cough   [] Hemoptysis   [] Wheeze  [] COPD   [] Asthma Neurologic:  [] Dizziness   [] Seizures   [] History of stroke   [] History of TIA  [] Aphasia   [] Vissual changes   [] Weakness or numbness in arm   [] Weakness or numbness in leg Musculoskeletal:   [] Joint swelling   [x] Joint pain   [] Low back pain Hematologic:  [] Easy bruising  [] Easy bleeding   [] Hypercoagulable state   [] Anemic Gastrointestinal:  [] Diarrhea   [] Vomiting  [x] Gastroesophageal reflux/heartburn   [] Difficulty swallowing. Genitourinary:  [] Chronic kidney disease   [] Difficult urination  [] Frequent urination   [] Blood in urine Skin:  [] Rashes   [] Ulcers  Psychological:  [] History of anxiety   []  History of major depression.  Physical Examination  Vitals:   09/09/23 1052  BP: (!) 164/70  Pulse: 64  Resp: 18  Weight: 203 lb 12.8 oz (92.4 kg)   Body mass index is 34.98 kg/m. Gen: WD/WN, NAD Head: [place]/AT, No temporalis wasting.  Ear/Nose/Throat: Hearing grossly intact, nares w/o erythema or drainage Eyes: PER, EOMI, sclera nonicteric.  Neck: Supple, no masses.  No bruit or JVD.  Pulmonary:  Good air movement, no audible wheezing, no use of accessory muscles.  Cardiac: RRR, normal S1, S2, no Murmurs. Vascular:  carotid bruit noted Vessel  Right Left  Radial Palpable Palpable  Carotid  Palpable  Palpable  Gastrointestinal: soft, non-distended. No guarding/no peritoneal signs.  Musculoskeletal: M/S 5/5 throughout.  No visible deformity.  Neurologic: CN 2-12 intact. Pain and light touch intact in extremities.  Symmetrical.  Speech is fluent. Motor exam as listed above. Psychiatric: Judgment intact, Mood & affect appropriate for pt's clinical situation. Dermatologic: No rashes or ulcers noted.  No changes consistent with cellulitis.   CBC Lab Results  Component Value Date   WBC 15.4 (H) 07/01/2022   HGB 11.5 (L) 07/01/2022   HCT 32.8 (L) 07/01/2022   MCV  84.5 07/01/2022   PLT 186 07/01/2022    BMET    Component Value Date/Time   NA 132 (L) 07/01/2022 0835   K 4.0 07/01/2022 0835   K 3.9 06/01/2013 1345   CL 99 07/01/2022 0835   CO2 23 07/01/2022 0835   GLUCOSE 122 (H) 07/01/2022 0835   BUN 17 07/01/2022 0835   CREATININE 0.81 07/01/2022 0835   CALCIUM  8.7 (L) 07/01/2022 0835   GFRNONAA >60 07/01/2022 0835   GFRAA >60 11/08/2017 1031   CrCl cannot be calculated (Patient's most recent lab result is older than the maximum 21 days allowed.).  COAG No results found for: INR, PROTIME  Radiology VAS US  CAROTID Result Date: 09/11/2023 Carotid Arterial Duplex Study Patient Name:  Gabrielle Morris [facility]  Date of Exam:   09/09/2023 Medical Rec #: [id number]              Accession #:    [id number] Date of Birth: 08-22-1940              Patient Gender: F Patient Age:   76 years Exam Location:  [facility] Procedure:      VAS US  CAROTID Referring Phys: CORDELLA SHAWL --------------------------------------------------------------------------------  Indications:       Carotid artery disease. Comparison Study:  07/2022 Performing Technologist: Jerel Croak RVT  Examination Guidelines: A complete evaluation includes B-mode imaging, spectral Doppler, color Doppler, and power Doppler as needed of all accessible portions  of each vessel. Bilateral testing is considered an integral part of a complete examination. Limited examinations for reoccurring indications may be performed as noted.  Right Carotid Findings: +----------+--------+--------+--------+----------------------+--------+           PSV cm/sEDV cm/sStenosisPlaque Description    Comments +----------+--------+--------+--------+----------------------+--------+ CCA Prox  62      14                                             +----------+--------+--------+--------+----------------------+--------+ CCA Mid   45      12                                             +----------+--------+--------+--------+----------------------+--------+ CCA Distal75      17                                             +----------+--------+--------+--------+----------------------+--------+ ICA Prox  111     26                                             +----------+--------+--------+--------+----------------------+--------+ ICA Mid   104     17                                             +----------+--------+--------+--------+----------------------+--------+ ICA Distal124     23      1-39%   calcific and irregular         +----------+--------+--------+--------+----------------------+--------+ ECA  45      5                                              +----------+--------+--------+--------+----------------------+--------+ +----------+--------+-------+---------+-------------------+           PSV cm/sEDV cmsDescribe Arm Pressure (mmHG) +----------+--------+-------+---------+-------------------+ Subclavian202            Turbulent                    +----------+--------+-------+---------+-------------------+ +---------+--------+--+--------+-+---------+ VertebralPSV cm/s61EDV cm/s7Antegrade +---------+--------+--+--------+-+---------+  Left Carotid Findings: +----------+--------+--------+--------+----------------------+--------+            PSV cm/sEDV cm/sStenosisPlaque Description    Comments +----------+--------+--------+--------+----------------------+--------+ CCA Prox  91      17                                             +----------+--------+--------+--------+----------------------+--------+ CCA Mid   68      14                                             +----------+--------+--------+--------+----------------------+--------+ CCA Distal83      15                                             +----------+--------+--------+--------+----------------------+--------+ ICA Prox  147     27      40-59%  calcific and irregular         +----------+--------+--------+--------+----------------------+--------+ ICA Mid   133     23                                             +----------+--------+--------+--------+----------------------+--------+ ICA Distal80      20                                             +----------+--------+--------+--------+----------------------+--------+ ECA       94      7                                              +----------+--------+--------+--------+----------------------+--------+ +----------+--------+--------+----------------+-------------------+           PSV cm/sEDV cm/sDescribe        Arm Pressure (mmHG) +----------+--------+--------+----------------+-------------------+ [id number]              Multiphasic, WNL                    +----------+--------+--------+----------------+-------------------+ +---------+--------+--+--------+--+---------+ VertebralPSV cm/s49EDV cm/s13Antegrade +---------+--------+--+--------+--+---------+   Summary: Right Carotid: Velocities in the right ICA are consistent with a 1-39% stenosis.                Non-hemodynamically significant plaque <50% noted in the CCA. The  ECA appears <50% stenosed. Left Carotid: Velocities in the left ICA are consistent with a 40-59% stenosis.                Non-hemodynamically significant plaque <50% noted in the CCA. The               ECA appears <50% stenosed. Vertebrals:  Bilateral vertebral arteries demonstrate antegrade flow. Subclavians: Right subclavian artery flow was disturbed. Normal flow              hemodynamics were seen in the left subclavian artery. *See table(s) above for measurements and observations.  Electronically signed by Cordella Shawl MD on 09/11/2023 at 2:35:38 PM.    Final      Assessment/Plan 1. Bilateral carotid artery stenosis (Primary) Recommend:   Given the patient's asymptomatic subcritical stenosis no further invasive testing or surgery at this time.   Duplex ultrasound of the carotid arteries demonstrates a stable 1-39% stenosis of the right internal carotid artery and 40-59% LICA stenosis.    Continue antiplatelet therapy as prescribed Continue management of CAD, HTN and Hyperlipidemia Healthy heart diet,  encouraged exercise at least 4 times per week Follow up in 12 months with duplex ultrasound and physical exam   - VAS US  CAROTID; Future  2. Benign essential hypertension Continue antihypertensive medications as already ordered, these medications have been reviewed and there are no changes at this time.  3. PAD (peripheral artery disease) (HCC) Recommend:   I do not find evidence of life style limiting vascular disease. The patient specifically denies life style limitation.   Previous noninvasive studies including ABI's of the legs do not identify critical vascular problems.   The patient should continue walking and begin a more formal exercise program. The patient should continue his antiplatelet therapy and aggressive treatment of the lipid abnormalities.   The patient is instructed to call the office if there is a significant change in the lower extremity symptoms, particularly if a wound develops or there is an abrupt increase in leg pain.  4. Gastroesophageal reflux disease, unspecified whether  esophagitis present Continue PPI as already ordered, this medication has been reviewed and there are no changes at this time.  Avoidence of caffeine and alcohol  Moderate elevation of the head of the bed      Cordella Shawl, MD  09/14/2023 1:05 PM

## 2023-10-18 ENCOUNTER — Ambulatory Visit
Admission: RE | Admit: 2023-10-18 | Discharge: 2023-10-18 | Disposition: A | Discharge status: Home / Self Care | Source: Ambulatory Visit | Attending: Internal Medicine | Admitting: Internal Medicine

## 2023-10-18 DIAGNOSIS — Z1231 Encounter for screening mammogram for malignant neoplasm of breast: Secondary | ICD-10-CM | POA: Diagnosis present

## 2024-09-07 ENCOUNTER — Encounter (INDEPENDENT_AMBULATORY_CARE_PROVIDER_SITE_OTHER)

## 2024-09-07 ENCOUNTER — Ambulatory Visit (INDEPENDENT_AMBULATORY_CARE_PROVIDER_SITE_OTHER): Admitting: Vascular Surgery
